# Patient Record
Sex: Female | Born: 1980 | Race: Black or African American | Hispanic: No | Marital: Married | Smoking: Former smoker
Health system: Southern US, Community
[De-identification: ages and names within clinical notes are randomized; demographics above are authoritative.]

---

## 2017-08-31 ENCOUNTER — Encounter: Payer: Self-pay | Admitting: Family Medicine

## 2017-08-31 ENCOUNTER — Ambulatory Visit: Payer: BC Managed Care – PPO | Admitting: Family Medicine

## 2017-08-31 VITALS — Temp 99.2°F | Ht 61.0 in | Wt 161.0 lb

## 2017-08-31 DIAGNOSIS — Z131 Encounter for screening for diabetes mellitus: Secondary | ICD-10-CM

## 2017-08-31 DIAGNOSIS — Z13220 Encounter for screening for lipoid disorders: Secondary | ICD-10-CM

## 2017-08-31 DIAGNOSIS — L659 Nonscarring hair loss, unspecified: Secondary | ICD-10-CM

## 2017-08-31 DIAGNOSIS — Z0000 Encounter for general adult medical examination without abnormal findings: Secondary | ICD-10-CM | POA: Diagnosis not present

## 2017-08-31 DIAGNOSIS — J302 Other seasonal allergic rhinitis: Secondary | ICD-10-CM

## 2017-08-31 LAB — BASIC METABOLIC PANEL
BUN: 10 mg/dL (ref 6–23)
CALCIUM: 9.1 mg/dL (ref 8.4–10.5)
CO2: 28 meq/L (ref 19–32)
Chloride: 105 mEq/L (ref 96–112)
Creatinine, Ser: 0.68 mg/dL (ref 0.40–1.20)
GFR: 103.33 mL/min (ref >60.00)
GLUCOSE: 83 mg/dL (ref 70–99)
Potassium: 4.2 mEq/L (ref 3.5–5.1)
SODIUM: 140 meq/L (ref 135–145)

## 2017-08-31 LAB — TSH: TSH: 1.07 u[IU]/mL (ref 0.35–4.50)

## 2017-08-31 LAB — LIPID PANEL
CHOL/HDL RATIO: 3
Cholesterol: 177 mg/dL (ref 0–200)
HDL: 52.6 mg/dL (ref >39.00)
LDL Cholesterol: 113 mg/dL — ABNORMAL HIGH (ref 0–99)
NONHDL: 124.89
Triglycerides: 57 mg/dL (ref 0.0–149.0)
VLDL: 11.4 mg/dL (ref 0.0–40.0)

## 2017-08-31 LAB — CBC WITH DIFFERENTIAL/PLATELET
BASOS ABS: 0 10*3/uL (ref 0.0–0.1)
BASOS PCT: 0.4 % (ref 0.0–3.0)
EOS PCT: 3.1 % (ref 0.0–5.0)
Eosinophils Absolute: 0.2 10*3/uL (ref 0.0–0.7)
HEMATOCRIT: 42.3 % (ref 36.0–46.0)
Hemoglobin: 14.4 g/dL (ref 12.0–15.0)
LYMPHS PCT: 23.5 % (ref 12.0–46.0)
Lymphs Abs: 1.5 10*3/uL (ref 0.7–4.0)
MCHC: 33.9 g/dL (ref 30.0–36.0)
MCV: 93 fl (ref 78.0–100.0)
MONOS PCT: 8.9 % (ref 3.0–12.0)
Monocytes Absolute: 0.6 10*3/uL (ref 0.1–1.0)
Neutro Abs: 4 10*3/uL (ref 1.4–7.7)
Neutrophils Relative %: 64.1 % (ref 43.0–77.0)
PLATELETS: 183 10*3/uL (ref 150.0–400.0)
RBC: 4.55 Mil/uL (ref 3.87–5.11)
RDW: 12.4 % (ref 11.5–15.5)
WBC: 6.2 10*3/uL (ref 4.0–10.5)

## 2017-08-31 LAB — T4, FREE: Free T4: 0.71 ng/dL (ref 0.60–1.60)

## 2017-08-31 LAB — IRON: Iron: 89 ug/dL (ref 42–145)

## 2017-08-31 LAB — HEMOGLOBIN A1C: Hgb A1c MFr Bld: 5.2 % (ref 4.6–6.5)

## 2017-08-31 LAB — FERRITIN: Ferritin: 18.5 ng/mL (ref 10.0–291.0)

## 2017-08-31 NOTE — Progress Notes (Signed)
Patient presents to clinic today to establish care.  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  [place].  Hair loss: -pt endorses thinning hair x 3 yrs since the birth of her son. -pt does not note hair shedding more than normal, heat or cold intolerance, diarrhea, or constipation.  Rash on back: -Pt endorses rash on her back since childhood. -pt thought she had vitiligo, but her mother always told her that it was similar to what her dad had on his back (tinea versicolor) and she should just use Selsun Blue on it. -Patient states that the rash has constantly the same. -Patient denies increased and hypopigmentation or itching  Seasonal allergies: -Patient will take Claritin as needed  Allergies: ADA Past surgical history: C-section 2011 and 2016  Social history: Patient has 2 kids.  Patient has a PhD.  Patient works as a Scientist, product/process development at [employer]  Patient endorses social alcohol use.  Patient endorses former tobacco use.  She quit in June 2019.  Patient states she smoked a 13 and then endorses regular smoking in her early 57s.  Patient was smoking 7 cigarettes/day.  She then changed to 2 black and milds per day.  Patient denies drug use.  Family medical history: Mom-alive, MI, heart disease, HTN Dad-alive, alcohol abuse, drug abuse Son-asthma MGM-alive, heart attack, heart disease MGF-deceased PGM-deceased, alcohol abuse PGF-deceased, alcohol abuse  Health Maintenance: Dental --[facility] P[facility] --2017 LMP--08/30/2017, ongoing  History reviewed. No pertinent past medical history.  Past Surgical History:  Procedure Laterality Date  . CESAREAN SECTION      No current outpatient medications on file prior to visit.   No current facility-administered medications on file prior to visit.     No Known Allergies  Family History  Problem  Relation Age of Onset  . Heart disease Mother   . Hypertension Mother   . Alcohol abuse Father   . Drug abuse Father   . Heart disease Maternal Grandmother     Social History   Socioeconomic History  . Marital status: Unknown    Spouse name: Not on file  . Number of children: Not on file  . Years of education: Not on file  . Highest education level: Not on file  Occupational History  . Not on file  Social Needs  . Financial resource strain: Not on file  . Food insecurity:    Worry: Not on file    Inability: Not on file  . Transportation needs:    Medical: Not on file    Non-medical: Not on file  Tobacco Use  . Smoking status: Former Games developer  . Smokeless tobacco: Former Engineer, water and Sexual Activity  . Alcohol use: Yes  . Drug use: Never  . Sexual activity: Yes  Lifestyle  . Physical activity:    Days per week: Not on file    Minutes per session: Not on file  . Stress: Not on file  Relationships  . Social connections:    Talks on phone: Not on file    Gets together: Not on file    Attends religious service: Not on file    Active member of club or organization: Not on file    Attends meetings of clubs or organizations: Not on file    Relationship status: Not on file  . Intimate partner violence:    Fear of current  or ex partner: Not on file    Emotionally abused: Not on file    Physically abused: Not on file    Forced sexual activity: Not on file  Other Topics Concern  . Not on file  Social History Narrative  . Not on file    ROS General: Denies fever, chills, night sweats, changes in weight, changes in appetite  + thinning hair HEENT: Denies headaches, ear pain, changes in vision, rhinorrhea, sore throat CV: Denies CP, palpitations, SOB, orthopnea Pulm: Denies SOB, cough, wheezing GI: Denies abdominal pain, nausea, vomiting, diarrhea, constipation GU: Denies dysuria, hematuria, frequency, vaginal discharge Msk: Denies muscle cramps, joint  pains Neuro: Denies weakness, numbness, tingling Skin: Denies rashes, bruising Psych: Denies depression, anxiety, hallucinations  Temp 99.2 F (37.3 C) (Oral)   Ht  (1.549 m)   Wt 161 lb (73 kg)   LMP 08/31/2017 (Exact Date)   BMI 30.42 kg/m   Physical Exam Gen. Pleasant, well developed, well-nourished, in NAD HEENT - [place]/AT, PERRL, EOMI, conjunctive clear, no scleral icterus, no nasal drainage, pharynx without erythema or exudate. Lungs: no use of accessory muscles, CTAB, no wheezes, rales or rhonchi Cardiovascular: RRR, no r/g/m, no peripheral edema Abdomen: BS present, soft, nontender,nondistended, no hepatosplenomegaly Musculoskeletal: No deformities, moves all four extremities, no cyanosis or clubbing, normal tone Neuro:  A&Ox3, CN II-XII intact, normal gait Skin:  Warm, dry, intact, no lesions.  Numerous hypopigmented circumscribed flat lesions on back Psych: normal affect, mood appropriate  No results found for this or any previous visit (from the past 2160 hour(s)).  Assessment/Plan: Well adult exam  -Anticipatory guidance given including wearing seatbelts, smoke detectors in the home, increasing physical activity, increasing p.o. intake of water, increasing p.o. intake of vegetables -Next CPE in 1 year -Last Pap 2017.  Due next year - Plan: Basic metabolic panel  Hair thinning -Consider referral to dermatology  - Plan: TSH, T4, free, CBC with Differential/Platelet, Ferritin, Iron  Seasonal allergies -Continue Claritin as needed  Screening for cholesterol level  - Plan: Lipid panel  Screening for diabetes mellitus  - Plan: Hemoglobin A1c  Follow-up PRN  Abbe Amsterdam, MD

## 2017-08-31 NOTE — Patient Instructions (Signed)
Preventive Care 18-39 Years, Female Preventive care refers to lifestyle choices and visits with your health care provider that can promote health and wellness. What does preventive care include?  A yearly physical exam. This is also called an annual well check.  Dental exams once or twice a year.  Routine eye exams. Ask your health care provider how often you should have your eyes checked.  Personal lifestyle choices, including: ? Daily care of your teeth and gums. ? Regular physical activity. ? Eating a healthy diet. ? Avoiding tobacco and drug use. ? Limiting alcohol use. ? Practicing safe sex. ? Taking vitamin and mineral supplements as recommended by your health care provider. What happens during an annual well check? The services and screenings done by your health care provider during your annual well check will depend on your age, overall health, lifestyle risk factors, and family history of disease. Counseling Your health care provider may ask you questions about your:  Alcohol use.  Tobacco use.  Drug use.  Emotional well-being.  Home and relationship well-being.  Sexual activity.  Eating habits.  Work and work Statistician.  Method of birth control.  Menstrual cycle.  Pregnancy history.  Screening You may have the following tests or measurements:  Height, weight, and BMI.  Diabetes screening. This is done by checking your blood sugar (glucose) after you have not eaten for a while (fasting).  Blood pressure.  Lipid and cholesterol levels. These may be checked every 5 years starting at age 66.  Skin check.  Hepatitis C blood test.  Hepatitis B blood test.  Sexually transmitted disease (STD) testing.  BRCA-related cancer screening. This may be done if you have a family history of breast, ovarian, tubal, or peritoneal cancers.  Pelvic exam and Pap test. This may be done every 3 years starting at age 40. Starting at age 59, this may be done every 5  years if you have a Pap test in combination with an HPV test.  Discuss your test results, treatment options, and if necessary, the need for more tests with your health care provider. Vaccines Your health care provider may recommend certain vaccines, such as:  Influenza vaccine. This is recommended every year.  Tetanus, diphtheria, and acellular pertussis (Tdap, Td) vaccine. You may need a Td booster every 10 years.  Varicella vaccine. You may need this if you have not been vaccinated.  HPV vaccine. If you are 69 or younger, you may need three doses over 6 months.  Measles, mumps, and rubella (MMR) vaccine. You may need at least one dose of MMR. You may also need a second dose.  Pneumococcal 13-valent conjugate (PCV13) vaccine. You may need this if you have certain conditions and were not previously vaccinated.  Pneumococcal polysaccharide (PPSV23) vaccine. You may need one or two doses if you smoke cigarettes or if you have certain conditions.  Meningococcal vaccine. One dose is recommended if you are age 27-21 years and a first-year college student living in a residence hall, or if you have one of several medical conditions. You may also need additional booster doses.  Hepatitis A vaccine. You may need this if you have certain conditions or if you travel or work in places where you may be exposed to hepatitis A.  Hepatitis B vaccine. You may need this if you have certain conditions or if you travel or work in places where you may be exposed to hepatitis B.  Haemophilus influenzae type b (Hib) vaccine. You may need this if  you have certain risk factors.  Talk to your health care provider about which screenings and vaccines you need and how often you need them. This information is not intended to replace advice given to you by your health care provider. Make sure you discuss any questions you have with your health care provider. Document Released: 06/14/2001 Document Revised: 01/06/2016  Document Reviewed: 02/17/2015 Elsevier Interactive Patient Education  [employer].

## 2017-09-06 ENCOUNTER — Encounter: Payer: Self-pay | Admitting: *Deleted

## 2019-07-11 ENCOUNTER — Ambulatory Visit: Payer: BC Managed Care – PPO | Attending: Family

## 2019-07-11 DIAGNOSIS — Z23 Encounter for immunization: Secondary | ICD-10-CM

## 2019-07-11 NOTE — Progress Notes (Signed)
   Covid-19 Vaccination Clinic  Name:  Janet Lee    MRN: [id number] DOB: Feb 09, 1981  07/11/2019  Janet Lee was observed post Covid-19 immunization for 15 minutes without incident. She was provided with Vaccine Information Sheet and instruction to access the V-Safe system.   Janet Lee was instructed to call 911 with any severe reactions post vaccine: Marland Kitchen Difficulty breathing  . Swelling of face and throat  . A fast heartbeat  . A bad rash all over body  . Dizziness and weakness   Immunizations Administered    Name Date Dose VIS Date Route   Moderna COVID-19 Vaccine 07/11/2019  4:18 PM 0.5 mL 04/02/2019 Intramuscular   Manufacturer: Moderna   Lot: [id number]   NDC: 37096-438-38

## 2019-08-13 ENCOUNTER — Ambulatory Visit: Payer: BC Managed Care – PPO | Attending: Family

## 2019-08-13 DIAGNOSIS — Z23 Encounter for immunization: Secondary | ICD-10-CM

## 2019-08-13 NOTE — Progress Notes (Signed)
   Covid-19 Vaccination Clinic  Name:  Janet Lee    MRN: [id number] DOB: Mar 06, 1981  08/13/2019  Ms. Janet Lee was observed post Covid-19 immunization for 15 minutes without incident. She was provided with Vaccine Information Sheet and instruction to access the V-Safe system.   Ms. Janet Lee was instructed to call 911 with any severe reactions post vaccine: Marland Kitchen Difficulty breathing  . Swelling of face and throat  . A fast heartbeat  . A bad rash all over body  . Dizziness and weakness   Immunizations Administered    Name Date Dose VIS Date Route   Moderna COVID-19 Vaccine 08/13/2019 11:57 AM 0.5 mL 04/02/2019 Intramuscular   Manufacturer: Moderna   Lot: [id number]   NDC: 31438-887-57

## 2020-01-29 NOTE — Progress Notes (Signed)
New Patient Note  RE: Janet Lee MRN: [id number] DOB: 1980-10-01 Date of Office Visit: 01/30/2020  Referring provider: No ref. provider found Primary care provider: Deeann Saint, MD  Chief Complaint: Wheezing and Allergies (sneezing)  History of Present Illness: I had the pleasure of seeing Janet Lee for initial evaluation at the [facility] on 01/30/2020. She is a 39 y.o. female, who is self-referred here by Deeann Saint, MD for the evaluation of asthma and allergic rhinitis.   Patient moved from [place] to [place] a few years ago and noticed the below symptoms.   Respiratory She reports symptoms of chest tightness, shortness of breath, coughing, wheezing for 3 years. Current medications include albuterol prn which help. She reports not using aerochamber with inhalers. She tried the following inhalers: none. Main triggers are allergies, infections. In the last month, frequency of symptoms: 0x/week. Frequency of nocturnal symptoms: 0x/month. Frequency of SABA use: 0x/week. Interference with physical activity: no. Sleep is undisturbed. In the last 12 months, emergency room visits/urgent care visits/doctor office visits or hospitalizations due to respiratory issues: one. In the last 12 months, oral steroids courses: no. Lifetime history of hospitalization for respiratory issues: no. Prior intubations: no.History of pneumonia: no. She was not evaluated by allergist/pulmonologist in the past. Smoking exposure: 1 cigs per day. Up to date with flu vaccine: yes. Up to date with COVID-19 vaccine: yes.  History of reflux: yes but not on any medications.  Rhinitis: She reports symptoms of itchy, sneezing, nasal congestion, rhinorrhea, itchy/watery eyes. Symptoms have been going on for 3 years. The symptoms are present mainly in the spring and fall. Other triggers include exposure to pollen. Anosmia: no. Headache: no. She has used Claritin and allegra, OTC nasal spray  with some improvement in symptoms. Sinus infections: no. Previous work up includes: none. Previous ENT evaluation: no. Previous sinus imaging: no. History of nasal polyps: no. Last eye exam: not recently.  Assessment and Plan: Onda is a 39 y.o. female with: Reactive airway disease Noted chest tightness, shortness of breath, coughing and wheezing for 3 years.  This started when she moved to [place] from [place]. Symptoms improved gradually. Uses albuterol on a rare occasion with good benefit.  Main triggers are allergies and infections.  Patient is a current tobacco user.  Today's spirometry was normal with 13% improvement in FEV1 post bronchodilator treatment.  Clinically feeling improved.    This is concerning for reactive airway disease. . Daily controller medication(s):  Start Singulair (montelukast) 10mg  daily at night. Cautioned that in some children/adults can experience behavioral changes including hyperactivity, agitation, depression, sleep disturbances and suicidal ideations. These side effects are rare, but if you notice them you should notify me and discontinue Singulair (montelukast). . May use albuterol rescue inhaler 2 puffs every 4 to 6 hours as needed for shortness of breath, chest tightness, coughing, and wheezing. May use albuterol rescue inhaler 2 puffs 5 to 15 minutes prior to strenuous physical activities. Monitor frequency of use.  . Repeat spirometry at next visit.  If symptoms not improved then will do a trial of ICS inhaler next.  Other allergic rhinitis Rhinoconjunctivitis symptoms for the past 3 years mainly in the spring and fall.  Tried Claritin, Allegra and nasal sprays with some benefit.  No prior allergy/ENT evaluation.  Today's skin testing showed: Positive to weed, ragweed, trees, mold, dust mites, cat and cockroach.  Start environmental control measures as below.  May use over the counter  antihistamines such as Zyrtec (cetirizine), Claritin  (loratadine), Allegra (fexofenadine), or Xyzal (levocetirizine) daily as needed. May take twice a day if needed. Start dymista (fluticasone + azelastine nasal spray combination) 1 spray per nostril twice a day. If it's not covered let us know.   May use olopatadine eye drops 0.2% once a day as needed for itchy/watery eyes.  We will discuss allergy immunotherapy in more detail next visit - handout given.  Allergic conjunctivitis of both eyes  See assessment and plan as above for allergic rhinitis.  Return in about 2 months (around 03/31/2020).  Meds ordered this encounter  Medications  . Azelastine-Fluticasone 137-50 MCG/ACT SUSP    Sig: Place 1 spray into the nose in the morning and at bedtime.    Dispense:  23 g    Refill:  5  . Olopatadine HCl 0.2 % SOLN    Sig: Apply 1 drop to eye daily as needed (itchy/watery eyes).    Dispense:  2.5 mL    Refill:  5  . montelukast (SINGULAIR) 10 MG tablet    Sig: Take 1 tablet (10 mg total) by mouth at bedtime.    Dispense:  30 tablet    Refill:  5   Other allergy screening: Food allergy: no Medication allergy: no Hymenoptera allergy: no Urticaria: no Eczema: yes  History of recurrent infections suggestive of immunodeficency: no  Diagnostics: Spirometry:  Tracings reviewed. Her effort: Good reproducible efforts. FVC: 3.40L FEV1: 2.89L, 126% predicted FEV1/FVC ratio: 85% Interpretation: Spirometry consistent with normal pattern with 13% improvement in FEV1 post bronchodilator treatment. Clinically feeling improved.  Please see scanned spirometry results for details.  Skin Testing: Environmental allergy panel. Positive to weed, ragweed, trees, mold, dust mites, cat and cockroach. Results discussed with patient/family.  Airborne Adult Perc - 01/30/20 0906    Time Antigen Placed 1610    Allergen Manufacturer Waynette Buttery    Location Back    Number of Test 59    Panel 1 Select    1. Control-Buffer 50% Glycerol Negative    2.  Control-Histamine 1 mg/ml 2+    3. Albumin saline Negative    4. Bahia Negative    5. French Southern Territories Negative    6. Johnson Negative    7. Kentucky Blue Negative    8. Meadow Fescue Negative    9. Perennial Rye Negative    10. Sweet Vernal Negative    11. Timothy Negative    12. Cocklebur Negative    13. Burweed Marshelder Negative    14. Ragweed, short 3+    15. Ragweed, Giant Negative    16. Plantain,  English 2+    17. Lamb's Quarters Negative    18. Sheep Sorrell Negative    19. Rough Pigweed Negative    20. Marsh Elder, Rough Negative    21. Mugwort, Common Negative    22. Ash mix Negative    23. Birch mix Negative    24. Beech American 2+    25. Box, Elder Negative    26. Cedar, red Negative    27. Cottonwood, Guinea-Bissau Negative    28. Elm mix Negative    29. Hickory Negative    30. Maple mix Negative    31. Oak, Guinea-Bissau mix Negative    32. Pecan Pollen Negative    33. Pine mix 2+    34. Sycamore Eastern Negative    35. Walnut, Black Pollen 2+    36. Alternaria alternata Negative    37. Cladosporium Herbarum Negative  38. Aspergillus mix 2+    39. Penicillium mix Negative    40. Bipolaris sorokiniana (Helminthosporium) Negative    41. Drechslera spicifera (Curvularia) Negative    42. Mucor plumbeus 2+    43. Fusarium moniliforme 2+    44. Aureobasidium pullulans (pullulara) 2+    45. Rhizopus oryzae Negative    46. Botrytis cinera Negative    47. Epicoccum nigrum 2+    48. Phoma betae Negative    49. Candida Albicans Negative    50. Trichophyton mentagrophytes Negative    51. Mite, D Farinae  5,000 AU/ml 4+    52. Mite, D Pteronyssinus  5,000 AU/ml 4+    53. Cat Hair 10,000 BAU/ml 4+    54.  Dog Epithelia Negative    55. Mixed Feathers Negative    56. Horse Epithelia Negative    57. Cockroach, German Negative    58. Mouse Negative    59. Tobacco Leaf Negative          Intradermal - 01/30/20 0934    Time Antigen Placed 3016    Allergen Manufacturer Waynette Buttery      Location Arm    Number of Test 8    Control Negative    French Southern Territories Negative    Johnson Negative    7 Grass Negative    Mold 1 Negative    Mold 3 Negative    Dog Negative    Cockroach 2+           Past Medical History: Patient Active Problem List   Diagnosis Date Noted  . Other allergic rhinitis 01/30/2020  . Reactive airway disease 01/30/2020  . Allergic conjunctivitis of both eyes 01/30/2020   History reviewed. No pertinent past medical history. Past Surgical History: Past Surgical History:  Procedure Laterality Date  . CESAREAN SECTION     Medication List:  Current Outpatient Medications  Medication Sig Dispense Refill  . Azelastine-Fluticasone 137-50 MCG/ACT SUSP Place 1 spray into the nose in the morning and at bedtime. 23 g 5  . montelukast (SINGULAIR) 10 MG tablet Take 1 tablet (10 mg total) by mouth at bedtime. 30 tablet 5  . Olopatadine HCl 0.2 % SOLN Apply 1 drop to eye daily as needed (itchy/watery eyes). 2.5 mL 5   No current facility-administered medications for this visit.   Allergies: No Known Allergies Social History: Social History   Socioeconomic History  . Marital status: Married    Spouse name: Not on file  . Number of children: Not on file  . Years of education: Not on file  . Highest education level: Not on file  Occupational History  . Not on file  Tobacco Use  . Smoking status: Current Every Day Smoker  . Smokeless tobacco: Former Engineer, water and Sexual Activity  . Alcohol use: Yes  . Drug use: Never  . Sexual activity: Yes  Other Topics Concern  . Not on file  Social History Narrative  . Not on file   Social Determinants of Health   Financial Resource Strain:   . Difficulty of Paying Living Expenses: Not on file  Food Insecurity:   . Worried About Programme researcher, broadcasting/film/video in the Last Year: Not on file  . Ran Out of Food in the Last Year: Not on file  Transportation Needs:   . Lack of Transportation (Medical): Not on file  .  Lack of Transportation (Non-Medical): Not on file  Physical Activity:   . Days of Exercise per Week: Not  on file  . Minutes of Exercise per Session: Not on file  Stress:   . Feeling of Stress : Not on file  Social Connections:   . Frequency of Communication with Friends and Family: Not on file  . Frequency of Social Gatherings with Friends and Family: Not on file  . Attends Religious Services: Not on file  . Active Member of Clubs or Organizations: Not on file  . Attends Banker Meetings: Not on file  . Marital Status: Not on file   Lives in a house. Smoking: yes - 1 cig/day? Occupation: Geophysicist/field seismologist professor  Environmental History: Immunologist in the house: no Engineer, civil (consulting) in the family room: yes Carpet in the bedroom: yes Heating: gas Cooling: central Pet: no  Family History: Family History  Problem Relation Age of Onset  . Heart disease Mother   . Hypertension Mother   . Alcohol abuse Father   . Drug abuse Father   . Heart disease Maternal Grandmother    Problem                               Relation Asthma                                   Son  Eczema                                Son  Food allergy                          Son, mother  Allergic rhino conjunctivitis     No   Review of Systems  Constitutional: Negative for appetite change, chills, fever and unexpected weight change.  HENT: Negative for congestion and rhinorrhea.   Eyes: Positive for itching.  Respiratory: Negative for cough, chest tightness, shortness of breath and wheezing.   Cardiovascular: Negative for chest pain.  Gastrointestinal: Negative for abdominal pain.  Genitourinary: Negative for difficulty urinating.  Skin: Negative for rash.  Allergic/Immunologic: Positive for environmental allergies.  Neurological: Negative for headaches.   Objective: BP 120/88   Pulse 80   Temp (!) 97.1 F (36.2 C) (Temporal)   Resp 16   Ht 5' 1.5" (1.562 m)   Wt 168 lb 6.4 oz (76.4 kg)    SpO2 98%   BMI 31.30 kg/m  Body mass index is 31.3 kg/m. Physical Exam Vitals and nursing note reviewed.  Constitutional:      Appearance: Normal appearance. She is well-developed.  HENT:     Head: Normocephalic and atraumatic.     Right Ear: External ear normal.     Left Ear: External ear normal.     Nose: Nose normal.     Mouth/Throat:     Mouth: Mucous membranes are moist.     Pharynx: Oropharynx is clear.  Eyes:     Conjunctiva/sclera: Conjunctivae normal.  Cardiovascular:     Rate and Rhythm: Normal rate and regular rhythm.     Heart sounds: Normal heart sounds. No murmur heard.  No friction rub. No gallop.   Pulmonary:     Effort: Pulmonary effort is normal.     Breath sounds: Normal breath sounds. No wheezing, rhonchi or rales.  Abdominal:     Palpations: Abdomen is soft.  Musculoskeletal:     Cervical back: Neck supple.  Skin:    General: Skin is warm.     Findings: No rash.  Neurological:     Mental Status: She is alert and oriented to person, place, and time.  Psychiatric:        Behavior: Behavior normal.    The plan was reviewed with the patient/family, and all questions/concerned were addressed.  It was my pleasure to see Lattie CornsJeannette today and participate in her care. Please feel free to contact me with any questions or concerns.  Sincerely,  Wyline MoodYoon Shrihan Putt, DO Allergy & Immunology  [facility] [place] office: [phone] [facility] office: [phone]

## 2020-01-30 ENCOUNTER — Encounter: Payer: Self-pay | Admitting: Allergy

## 2020-01-30 ENCOUNTER — Other Ambulatory Visit: Payer: Self-pay

## 2020-01-30 ENCOUNTER — Ambulatory Visit (INDEPENDENT_AMBULATORY_CARE_PROVIDER_SITE_OTHER): Payer: BC Managed Care – PPO | Admitting: Allergy

## 2020-01-30 VITALS — BP 120/88 | HR 80 | Temp 97.1°F | Resp 16 | Ht 61.5 in | Wt 168.4 lb

## 2020-01-30 DIAGNOSIS — J3089 Other allergic rhinitis: Secondary | ICD-10-CM | POA: Diagnosis not present

## 2020-01-30 DIAGNOSIS — J4520 Mild intermittent asthma, uncomplicated: Secondary | ICD-10-CM

## 2020-01-30 DIAGNOSIS — H1013 Acute atopic conjunctivitis, bilateral: Secondary | ICD-10-CM | POA: Insufficient documentation

## 2020-01-30 DIAGNOSIS — J45909 Unspecified asthma, uncomplicated: Secondary | ICD-10-CM | POA: Insufficient documentation

## 2020-01-30 MED ORDER — AZELASTINE-FLUTICASONE 137-50 MCG/ACT NA SUSP: 1.0000 | Freq: Two times a day (BID) | NASAL | 5 refills | Status: DC

## 2020-01-30 MED ORDER — MONTELUKAST SODIUM 10 MG PO TABS: 10.0000 mg | ORAL_TABLET | Freq: Every day | ORAL | 5 refills | Status: DC

## 2020-01-30 MED ORDER — OLOPATADINE HCL 0.2 % OP SOLN: 1.0000 [drp] | Freq: Every day | OPHTHALMIC | 5 refills | Status: DC | PRN

## 2020-01-30 NOTE — Patient Instructions (Addendum)
Today's skin testing showed: Positive to weed, ragweed, trees, mold, dust mites, cat and cockroach.  Environmental allergies  Start environmental control measures as below.  May use over the counter antihistamines such as Zyrtec (cetirizine), Claritin (loratadine), Allegra (fexofenadine), or Xyzal (levocetirizine) daily as needed. May take twice a day if needed. Start dymista (fluticasone + azelastine nasal spray combination) 1 spray per nostril twice a day. If it's not covered let us know.   May use olopatadine eye drops 0.2% once a day as needed for itchy/watery eyes.  Read about allergy injections.  Breathing:  Your breathing test did show improvement after your treatment.  . Daily controller medication(s):  Start Singulair (montelukast) 10mg  daily at night. Cautioned that in some children/adults can experience behavioral changes including hyperactivity, agitation, depression, sleep disturbances and suicidal ideations. These side effects are rare, but if you notice them you should notify me and discontinue Singulair (montelukast). . May use albuterol rescue inhaler 2 puffs every 4 to 6 hours as needed for shortness of breath, chest tightness, coughing, and wheezing. May use albuterol rescue inhaler 2 puffs 5 to 15 minutes prior to strenuous physical activities. Monitor frequency of use.  . Asthma control goals:  o Full participation in all desired activities (may need albuterol before activity) o Albuterol use two times or less a week on average (not counting use with activity) o Cough interfering with sleep two times or less a month o Oral steroids no more than once a year o No hospitalizations  Follow up in 2 months or sooner if needed.   Reducing Pollen Exposure . Pollen seasons: trees (spring), grass (summer) and ragweed/weeds (fall). 09-08-1980 Keep windows closed in your home and car to lower pollen exposure.  Marland Kitchen air conditioning in the bedroom and throughout the house if  possible.  . Avoid going out in dry windy days - especially early morning. . Pollen counts are highest between 5 - 10 AM and on dry, hot and windy days.  . Save outside activities for late afternoon or after a heavy rain, when pollen levels are lower.  . Avoid mowing of grass if you have grass pollen allergy. Lilian Kapur Be aware that pollen can also be transported indoors on people and pets.  . Dry your clothes in an automatic dryer rather than hanging them outside where they might collect pollen.  . Rinse hair and eyes before bedtime. Mold Control . Mold and fungi can grow on a variety of surfaces provided certain temperature and moisture conditions exist.  . Outdoor molds grow on plants, decaying vegetation and soil. The major outdoor mold, Alternaria and Cladosporium, are found in very high numbers during hot and dry conditions. Generally, a late summer - fall peak is seen for common outdoor fungal spores. Rain will temporarily lower outdoor mold spore count, but counts rise rapidly when the rainy period ends. . The most important indoor molds are Aspergillus and Penicillium. Dark, humid and poorly ventilated basements are ideal sites for mold growth. The next most common sites of mold growth are the bathroom and the kitchen. Outdoor (Seasonal) Mold Control . Use air conditioning and keep windows closed. . Avoid exposure to decaying vegetation. 02-26-1976 Avoid leaf raking. . Avoid grain handling. . Consider wearing a face mask if working in moldy areas.  Indoor (Perennial) Mold Control  . Maintain humidity below 50%. . Get rid of mold growth on hard surfaces with water, detergent and, if necessary, 5% bleach (do not mix with other cleaners). Then  dry the area completely. If mold covers an area more than 10 square feet, consider hiring an indoor environmental professional. . For clothing, washing with soap and water is best. If moldy items cannot be cleaned and dried, throw them away. . Remove sources e.g.  contaminated carpets. . Repair and seal leaking roofs or pipes. Using dehumidifiers in damp basements may be helpful, but empty the water and clean units regularly to prevent mildew from forming. All rooms, especially basements, bathrooms and kitchens, require ventilation and cleaning to deter mold and mildew growth. Avoid carpeting on concrete or damp floors, and storing items in damp areas. Control of House Dust Mite Allergen . Dust mite allergens are a common trigger of allergy and asthma symptoms. While they can be found throughout the house, these microscopic creatures thrive in warm, humid environments such as bedding, upholstered furniture and carpeting. . Because so much time is spent in the bedroom, it is essential to reduce mite levels there.  . Encase pillows, mattresses, and box springs in special allergen-proof fabric covers or airtight, zippered plastic covers.  . Bedding should be washed weekly in hot water (130 F) and dried in a hot dryer. Allergen-proof covers are available for comforters and pillows that can't be regularly washed.  Reyes Ivan the allergy-proof covers every few months. Minimize clutter in the bedroom. Keep pets out of the bedroom.  Marland Kitchen Keep humidity less than 50% by using a dehumidifier or air conditioning. You can buy a humidity measuring device called a hygrometer to monitor this.  . If possible, replace carpets with hardwood, linoleum, or washable area rugs. If that's not possible, vacuum frequently with a vacuum that has a HEPA filter. . Remove all upholstered furniture and non-washable window drapes from the bedroom. . Remove all non-washable stuffed toys from the bedroom.  Wash stuffed toys weekly. Pet Allergen Avoidance: . Contrary to popular opinion, there are no "hypoallergenic" breeds of dogs or cats. That is because people are not allergic to an animal's hair, but to an allergen found in the animal's saliva, dander (dead skin flakes) or urine. Pet allergy symptoms  typically occur within minutes. For some people, symptoms can build up and become most severe 8 to 12 hours after contact with the animal. People with severe allergies can experience reactions in public places if dander has been transported on the pet owners' clothing. Marland Kitchen Keeping an animal outdoors is only a partial solution, since homes with pets in the yard still have higher concentrations of animal allergens. . Before getting a pet, ask your allergist to determine if you are allergic to animals. If your pet is already considered part of your family, try to minimize contact and keep the pet out of the bedroom and other rooms where you spend a great deal of time. . As with dust mites, vacuum carpets often or replace carpet with a hardwood floor, tile or linoleum. . High-efficiency particulate air (HEPA) cleaners can reduce allergen levels over time. . While dander and saliva are the source of cat and dog allergens, urine is the source of allergens from rabbits, hamsters, mice and [place] pigs; so ask a non-allergic family member to clean the animal's cage. . If you have a pet allergy, talk to your allergist about the potential for allergy immunotherapy (allergy shots). This strategy can often provide long-term relief. Cockroach Allergen Avoidance Cockroaches are often found in the homes of densely populated urban areas, schools or commercial buildings, but these creatures can lurk almost anywhere. This  does not mean that you have a dirty house or living area. . Block all areas where roaches can enter the home. This includes crevices, wall cracks and windows.  . Cockroaches need water to survive, so fix and seal all leaky faucets and pipes. Have an exterminator go through the house when your family and pets are gone to eliminate any remaining roaches. Marland Kitchen Keep food in lidded containers and put pet food dishes away after your pets are done eating. Vacuum and sweep the floor after meals, and take out garbage  and recyclables. Use lidded garbage containers in the kitchen. Wash dishes immediately after use and clean under stoves, refrigerators or toasters where crumbs can accumulate. Wipe off the stove and other kitchen surfaces and cupboards regularly.

## 2020-01-30 NOTE — Assessment & Plan Note (Signed)
Rhinoconjunctivitis symptoms for the past 3 years mainly in the spring and fall.  Tried Claritin, Allegra and nasal sprays with some benefit.  No prior allergy/ENT evaluation.  Today's skin testing showed: Positive to weed, ragweed, trees, mold, dust mites, cat and cockroach.  Start environmental control measures as below.  May use over the counter antihistamines such as Zyrtec (cetirizine), Claritin (loratadine), Allegra (fexofenadine), or Xyzal (levocetirizine) daily as needed. May take twice a day if needed. Start dymista (fluticasone + azelastine nasal spray combination) 1 spray per nostril twice a day. If it's not covered let us know.   May use olopatadine eye drops 0.2% once a day as needed for itchy/watery eyes.  We will discuss allergy immunotherapy in more detail next visit - handout given.

## 2020-01-30 NOTE — Assessment & Plan Note (Signed)
   See assessment and plan as above for allergic rhinitis.  

## 2020-01-30 NOTE — Assessment & Plan Note (Addendum)
Noted chest tightness, shortness of breath, coughing and wheezing for 3 years.  This started when she moved to [place] from [place]. Symptoms improved gradually. Uses albuterol on a rare occasion with good benefit.  Main triggers are allergies and infections.  Patient is a current tobacco user.  Today's spirometry was normal with 13% improvement in FEV1 post bronchodilator treatment.  Clinically feeling improved.    This is concerning for reactive airway disease. . Daily controller medication(s):  Start Singulair (montelukast) 10mg  daily at night. Cautioned that in some children/adults can experience behavioral changes including hyperactivity, agitation, depression, sleep disturbances and suicidal ideations. These side effects are rare, but if you notice them you should notify me and discontinue Singulair (montelukast). . May use albuterol rescue inhaler 2 puffs every 4 to 6 hours as needed for shortness of breath, chest tightness, coughing, and wheezing. May use albuterol rescue inhaler 2 puffs 5 to 15 minutes prior to strenuous physical activities. Monitor frequency of use.  . Repeat spirometry at next visit.  If symptoms not improved then will do a trial of ICS inhaler next.

## 2020-02-06 ENCOUNTER — Encounter: Payer: BC Managed Care – PPO | Admitting: Family Medicine

## 2020-03-31 DIAGNOSIS — H1010 Acute atopic conjunctivitis, unspecified eye: Secondary | ICD-10-CM | POA: Insufficient documentation

## 2020-03-31 NOTE — Progress Notes (Deleted)
Follow Up Note  RE: Janet Lee MRN: [id number] DOB: 1980/08/06 Date of Office Visit: 04/01/2020  Referring provider: Deeann Saint, MD Primary care provider: Deeann Saint, MD  Chief Complaint: No chief complaint on file.  History of Present Illness: I had the pleasure of seeing Janet Lee for a follow up visit at the [facility] on 03/31/2020. She is a 39 y.o. female, who is being followed for reactive airway disease and allergic rhinoconjunctivitis. Her previous allergy office visit was on 01/30/2020 with Dr. Selena Batten. Today is a regular follow up visit.  Reactive airway disease Noted chest tightness, shortness of breath, coughing and wheezing for 3 years.  This started when she moved to [place] from [place]. Symptoms improved gradually. Uses albuterol on a rare occasion with good benefit.  Main triggers are allergies and infections.  Patient is a current tobacco user.  Today's spirometry was normal with 13% improvement in FEV1 post bronchodilator treatment.  Clinically feeling improved.    This is concerning for reactive airway disease.  Daily controller medication(s):  Start Singulair (montelukast) 10mg  daily at night.  Cautioned that in some children/adults can experience behavioral changes including hyperactivity, agitation, depression, sleep disturbances and suicidal ideations. These side effects are rare, but if you notice them you should notify me and discontinue Singulair (montelukast).  May use albuterol rescue inhaler 2 puffs every 4 to 6 hours as needed for shortness of breath, chest tightness, coughing, and wheezing. May use albuterol rescue inhaler 2 puffs 5 to 15 minutes prior to strenuous physical activities. Monitor frequency of use.   Repeat spirometry at next visit.  If symptoms not improved then will do a trial of ICS inhaler next.  Other allergic rhinitis Rhinoconjunctivitis symptoms for the past 3 years mainly in the  spring and fall.  Tried Claritin, Allegra and nasal sprays with some benefit.  No prior allergy/ENT evaluation.  Today's skin testing showed: Positive to weed, ragweed, trees, mold, dust mites, cat and cockroach.  Start environmental control measures as below.  May use over the counter antihistamines such as Zyrtec (cetirizine), Claritin (loratadine), Allegra (fexofenadine), or Xyzal (levocetirizine) daily as needed. May take twice a day if needed.  Start dymista (fluticasone + azelastine nasal spray combination) 1 spray per nostril twice a day. If it's not covered let 11-21-1990 know.   May use olopatadine eye drops 0.2% once a day as needed for itchy/watery eyes.  We will discuss allergy immunotherapy in more detail next visit - handout given.  Allergic conjunctivitis of both eyes  See assessment and plan as above for allergic rhinitis.  Return in about 2 months (around 03/31/2020).  Assessment and Plan: Janet Lee is a 39 y.o. female with: No problem-specific Assessment & Plan notes found for this encounter.  No follow-ups on file.  No orders of the defined types were placed in this encounter.  Lab Orders  No laboratory test(s) ordered today    Diagnostics: Spirometry:  Tracings reviewed. Her effort: {Blank single:19197::"Good reproducible efforts.","It was hard to get consistent efforts and there is a question as to whether this reflects a maximal maneuver.","Poor effort, data can not be interpreted."} FVC: ***L FEV1: ***L, ***% predicted FEV1/FVC ratio: ***% Interpretation: {Blank single:19197::"Spirometry consistent with mild obstructive disease","Spirometry consistent with moderate obstructive disease","Spirometry consistent with severe obstructive disease","Spirometry consistent with possible restrictive disease","Spirometry consistent with mixed obstructive and restrictive disease","Spirometry uninterpretable due to technique","Spirometry consistent with normal pattern","No  overt abnormalities noted given today's efforts"}.  Please  see scanned spirometry results for details.  Skin Testing: {Blank single:19197::"Select foods","Environmental allergy panel","Environmental allergy panel and select foods","Food allergy panel","None","Deferred due to recent antihistamines use"}. Positive test to: ***. Negative test to: ***.  Results discussed with patient/family.   Medication List:  Current Outpatient Medications  Medication Sig Dispense Refill  . Azelastine-Fluticasone 137-50 MCG/ACT SUSP Place 1 spray into the nose in the morning and at bedtime. 23 g 5  . montelukast (SINGULAIR) 10 MG tablet Take 1 tablet (10 mg total) by mouth at bedtime. 30 tablet 5  . Olopatadine HCl 0.2 % SOLN Apply 1 drop to eye daily as needed (itchy/watery eyes). 2.5 mL 5   No current facility-administered medications for this visit.   Allergies: No Known Allergies I reviewed her past medical history, social history, family history, and environmental history and no significant changes have been reported from her previous visit.  Review of Systems  Constitutional: Negative for appetite change, chills, fever and unexpected weight change.  HENT: Negative for congestion and rhinorrhea.   Eyes: Positive for itching.  Respiratory: Negative for cough, chest tightness, shortness of breath and wheezing.   Cardiovascular: Negative for chest pain.  Gastrointestinal: Negative for abdominal pain.  Genitourinary: Negative for difficulty urinating.  Skin: Negative for rash.  Allergic/Immunologic: Positive for environmental allergies.  Neurological: Negative for headaches.   Objective: There were no vitals taken for this visit. There is no height or weight on file to calculate BMI. Physical Exam Vitals and nursing note reviewed.  Constitutional:      Appearance: Normal appearance. She is well-developed.  HENT:     Head: Normocephalic and atraumatic.     Right Ear: External ear normal.      Left Ear: External ear normal.     Nose: Nose normal.     Mouth/Throat:     Mouth: Mucous membranes are moist.     Pharynx: Oropharynx is clear.  Eyes:     Conjunctiva/sclera: Conjunctivae normal.  Cardiovascular:     Rate and Rhythm: Normal rate and regular rhythm.     Heart sounds: Normal heart sounds. No murmur heard.  No friction rub. No gallop.   Pulmonary:     Effort: Pulmonary effort is normal.     Breath sounds: Normal breath sounds. No wheezing, rhonchi or rales.  Abdominal:     Palpations: Abdomen is soft.  Musculoskeletal:     Cervical back: Neck supple.  Skin:    General: Skin is warm.     Findings: No rash.  Neurological:     Mental Status: She is alert and oriented to person, place, and time.  Psychiatric:        Behavior: Behavior normal.    Previous notes and tests were reviewed. The plan was reviewed with the patient/family, and all questions/concerned were addressed.  It was my pleasure to see Sierah today and participate in her care. Please feel free to contact me with any questions or concerns.  Sincerely,  Wyline Mood, DO Allergy & Immunology  [facility] office: [phone] [facility] office: [phone]

## 2020-04-01 ENCOUNTER — Ambulatory Visit: Payer: BC Managed Care – PPO | Admitting: Allergy

## 2020-04-01 DIAGNOSIS — H1010 Acute atopic conjunctivitis, unspecified eye: Secondary | ICD-10-CM

## 2020-04-01 DIAGNOSIS — J4520 Mild intermittent asthma, uncomplicated: Secondary | ICD-10-CM

## 2020-12-08 ENCOUNTER — Ambulatory Visit (HOSPITAL_BASED_OUTPATIENT_CLINIC_OR_DEPARTMENT_OTHER): Payer: BC Managed Care – PPO | Admitting: Nurse Practitioner

## 2020-12-08 NOTE — Progress Notes (Deleted)
  Shawna Clamp, DNP, AGNP-c Primary Care Services ______________________________________________________________________  HPI Janet Lee is a 40 y.o. female presenting to [facility] today to establish care.   Patient Care Team: Deeann Saint, MD as PCP - General (Family Medicine)  Health Maintenance  Topic Date Due   Pneumococcal Vaccination (1 - PCV) Never done   HIV Screening  Never done   Hepatitis C Screening: USPSTF Recommendation to screen - Ages 51-79 yo.  Never done   Pap Smear  Never done   COVID-19 Vaccine (3 - Booster for Moderna series) 01/13/2020   Flu Shot  11/30/2020   Tetanus Vaccine  04/22/2024   HPV Vaccine  Aged Out     Concerns today: ***   Patient Active Problem List   Diagnosis Date Noted   Seasonal and perennial allergic rhinoconjunctivitis 03/31/2020   Other allergic rhinitis 01/30/2020   Reactive airway disease 01/30/2020   Allergic conjunctivitis of both eyes 01/30/2020    PHQ9 Today: No flowsheet data found. GAD7 Today: No flowsheet data found. ______________________________________________________________________ PMH No past medical history on file.  ROS All review of systems negative except what is listed in the HPI  PHYSICAL EXAM Physical Exam ______________________________________________________________________ ASSESSMENT AND PLAN Problem List Items Addressed This Visit   None   Education provided today during visit and on AVS for patient to review at home.  Diet and Exercise recommendations provided.  Current diagnoses and recommendations discussed. HM recommendations reviewed with recommendations.    Outpatient Encounter Medications as of 12/08/2020  Medication Sig   Azelastine-Fluticasone 137-50 MCG/ACT SUSP Place 1 spray into the nose in the morning and at bedtime.   montelukast (SINGULAIR) 10 MG tablet Take 1 tablet (10 mg total) by mouth at  bedtime.   Olopatadine HCl 0.2 % SOLN Apply 1 drop to eye daily as needed (itchy/watery eyes).   No facility-administered encounter medications on file as of 12/08/2020.    No follow-ups on file.  Time: ***minutes, >50% spent counseling, care coordination, chart review, and documentation.   Tollie Eth, DNP, AGNP-c

## 2021-01-21 ENCOUNTER — Ambulatory Visit (HOSPITAL_BASED_OUTPATIENT_CLINIC_OR_DEPARTMENT_OTHER): Payer: BC Managed Care – PPO | Admitting: Nurse Practitioner

## 2021-01-21 ENCOUNTER — Encounter (HOSPITAL_BASED_OUTPATIENT_CLINIC_OR_DEPARTMENT_OTHER): Payer: Self-pay

## 2021-03-23 ENCOUNTER — Ambulatory Visit
Admission: EM | Admit: 2021-03-23 | Discharge: 2021-03-23 | Disposition: A | Discharge status: Home / Self Care | Payer: BC Managed Care – PPO | Attending: Internal Medicine | Admitting: Internal Medicine

## 2021-03-23 ENCOUNTER — Other Ambulatory Visit: Payer: Self-pay

## 2021-03-23 DIAGNOSIS — R10A0 Flank pain, unspecified side: Secondary | ICD-10-CM

## 2021-03-23 DIAGNOSIS — R1031 Right lower quadrant pain: Secondary | ICD-10-CM

## 2021-03-23 DIAGNOSIS — R109 Unspecified abdominal pain: Secondary | ICD-10-CM | POA: Diagnosis not present

## 2021-03-23 LAB — POCT URINALYSIS DIP (MANUAL ENTRY)
Bilirubin, UA: NEGATIVE
Glucose, UA: NEGATIVE mg/dL
Ketones, POC UA: NEGATIVE mg/dL
Leukocytes, UA: NEGATIVE
Nitrite, UA: NEGATIVE
Spec Grav, UA: >=1.03 — AB (ref 1.010–1.025)
Urobilinogen, UA: 0.2 E.U./dL
pH, UA: 5.5 (ref 5.0–8.0)

## 2021-03-23 LAB — POCT URINE PREGNANCY: Preg Test, Ur: NEGATIVE

## 2021-03-23 MED ORDER — DICYCLOMINE HCL 20 MG PO TABS: 20.0000 mg | ORAL_TABLET | Freq: Two times a day (BID) | ORAL | 0 refills | Status: DC

## 2021-03-23 NOTE — Discharge Instructions (Addendum)
Not able to rule out kidney stone or colitis. Pt will need to have a ct or ultrasound completed.  Urine did not show any infection slight blood  Call your pcp to see if you can get in for out pt if not go to er  Take meds s needed

## 2021-03-23 NOTE — ED Provider Notes (Signed)
[facility]    CSN: [id number] Arrival date & time: 03/23/21  1106      History   Chief Complaint Chief Complaint  Patient presents with   Back Pain    HPI Janet Lee is a 40 y.o. female.   Rt side flank and lower abd pain for 5 weeks now intermit . States that it hurts more with movement. Denies any injury. No urinary sx. Normal bowl movements. Denies any fevers.  Has not seen anyone for this. Menstrual cycle due in 4 days    History reviewed. No pertinent past medical history.  Patient Active Problem List   Diagnosis Date Noted   Seasonal and perennial allergic rhinoconjunctivitis 03/31/2020   Other allergic rhinitis 01/30/2020   Reactive airway disease 01/30/2020   Allergic conjunctivitis of both eyes 01/30/2020    Past Surgical History:  Procedure Laterality Date   CESAREAN SECTION      OB History   No obstetric history on file.      Home Medications    Prior to Admission medications   Medication Sig Start Date End Date Taking? Authorizing Provider  dicyclomine (BENTYL) 20 MG tablet Take 1 tablet (20 mg total) by mouth 2 (two) times daily. 03/23/21  Yes Coralyn Mark, NP  albuterol (VENTOLIN HFA) 108 (90 Base) MCG/ACT inhaler Inhale into the lungs. 11/07/19   [provider]  Azelastine-Fluticasone 137-50 MCG/ACT SUSP Place 1 spray into the nose in the morning and at bedtime. 01/30/20   Ellamae Sia, DO  montelukast (SINGULAIR) 10 MG tablet Take 1 tablet (10 mg total) by mouth at bedtime. 01/30/20   Ellamae Sia, DO  Norgestimate-Ethinyl Estradiol Triphasic 0.18/0.215/0.25 MG-35 MCG tablet Take 1 tablet by mouth daily. 11/28/16   [provider]  Olopatadine HCl 0.2 % SOLN Apply 1 drop to eye daily as needed (itchy/watery eyes). 01/30/20   Ellamae Sia, DO    Family History Family History  Problem Relation Age of Onset   Heart disease Mother    Hypertension Mother    Alcohol abuse Father    Drug abuse Father     Heart disease Maternal Grandmother     Social History Social History   Tobacco Use   Smoking status: Every Day   Smokeless tobacco: Former  Substance Use Topics   Alcohol use: Yes   Drug use: Never     Allergies   American cockroach, Cat hair extract, Dust mite extract, Mixed ragweed, Molds & smuts, Tree extract, and Grass pollen(k-o-r-t-swt vern)   Review of Systems Review of Systems  Constitutional:  Negative for activity change and fever.  Respiratory: Negative.    Cardiovascular: Negative.   Gastrointestinal:  Positive for abdominal pain. Negative for constipation, diarrhea and nausea.  Genitourinary:  Positive for hematuria and pelvic pain. Negative for dysuria, frequency, urgency, vaginal bleeding, vaginal discharge and vaginal pain.  Musculoskeletal: Negative.   Skin: Negative.   Neurological: Negative.     Physical Exam Triage Vital Signs ED Triage Vitals  Enc Vitals Group     BP 03/23/21 1218 (!) 148/85     Pulse Rate 03/23/21 1218 79     Resp 03/23/21 1218 16     Temp 03/23/21 1218 99 F (37.2 C)     Temp Source 03/23/21 1218 Oral     SpO2 03/23/21 1218 98 %     Weight --      Height --      Head Circumference --  Peak Flow --      Pain Score 03/23/21 1217 5     Pain Loc --      Pain Edu? --      Excl. in GC? --    No data found.  Updated Vital Signs BP (!) 148/85 (BP Location: Left Arm)   Pulse 79   Temp 99 F (37.2 C) (Oral)   Resp 16   LMP 03/01/2021   SpO2 98%   Visual Acuity Right Eye Distance:   Left Eye Distance:   Bilateral Distance:    Right Eye Near:   Left Eye Near:    Bilateral Near:     Physical Exam Constitutional:      Appearance: Normal appearance. She is normal weight.  Eyes:     Pupils: Pupils are equal, round, and reactive to light.  Cardiovascular:     Rate and Rhythm: Normal rate.  Pulmonary:     Effort: Pulmonary effort is normal.  Abdominal:     General: Abdomen is flat. Bowel sounds are normal.      Tenderness: There is no right CVA tenderness, left CVA tenderness, guarding or rebound.  Musculoskeletal:        General: Normal range of motion.  Skin:    General: Skin is warm.  Neurological:     Mental Status: She is alert.     UC Treatments / Results  Labs (all labs ordered are listed, but only abnormal results are displayed) Labs Reviewed  POCT URINALYSIS DIP (MANUAL ENTRY) - Abnormal; Notable for the following components:      Result Value   Spec Grav, UA >=1.030 (*)    Blood, UA trace-lysed (*)    Protein Ur, POC trace (*)    All other components within normal limits  POCT URINE PREGNANCY    EKG   Radiology No results found.  Procedures Procedures (including critical care time)  Medications Ordered in UC Medications - No data to display  Initial Impression / Assessment and Plan / UC Course  I have reviewed the triage vital signs and the nursing notes.  Pertinent labs & imaging results that were available during my care of the patient were reviewed by me and considered in my medical decision making (see chart for details).    Not able to rule out kidney stone or colitis. Pt will need to have a ct or ultrasound completed.  Urine did not show any infection slight blood  Call your pcp to see if you can get in for out pt if not go to er  Take meds s needed  Final Clinical Impressions(s) / UC Diagnoses   Final diagnoses:  Flank pain  Right lower quadrant abdominal pain     Discharge Instructions      Not able to rule out kidney stone or colitis. Pt will need to have a ct or ultrasound completed.  Urine did not show any infection slight blood  Call your pcp to see if you can get in for out pt if not go to er  Take meds s needed       ED Prescriptions     Medication Sig Dispense Auth. Provider   dicyclomine (BENTYL) 20 MG tablet Take 1 tablet (20 mg total) by mouth 2 (two) times daily. 20 tablet Coralyn Mark, NP      PDMP not reviewed  this encounter.   Coralyn Mark, NP 03/23/21 1320

## 2021-03-23 NOTE — ED Triage Notes (Signed)
Pt present right side back/abdomen area. Pt state the pain is the median section of her back and stomach. Symptoms started five weeks ago.

## 2021-03-24 ENCOUNTER — Emergency Department (HOSPITAL_BASED_OUTPATIENT_CLINIC_OR_DEPARTMENT_OTHER)
Admission: EM | Admit: 2021-03-24 | Discharge: 2021-03-24 | Disposition: A | Discharge status: Home / Self Care | Payer: BC Managed Care – PPO | Attending: Emergency Medicine | Admitting: Emergency Medicine

## 2021-03-24 ENCOUNTER — Encounter (HOSPITAL_BASED_OUTPATIENT_CLINIC_OR_DEPARTMENT_OTHER): Payer: Self-pay

## 2021-03-24 ENCOUNTER — Emergency Department (HOSPITAL_BASED_OUTPATIENT_CLINIC_OR_DEPARTMENT_OTHER): Payer: BC Managed Care – PPO

## 2021-03-24 DIAGNOSIS — Z7951 Long term (current) use of inhaled steroids: Secondary | ICD-10-CM | POA: Insufficient documentation

## 2021-03-24 DIAGNOSIS — R197 Diarrhea, unspecified: Secondary | ICD-10-CM | POA: Diagnosis not present

## 2021-03-24 DIAGNOSIS — R10A0 Flank pain, unspecified side: Secondary | ICD-10-CM

## 2021-03-24 DIAGNOSIS — F419 Anxiety disorder, unspecified: Secondary | ICD-10-CM | POA: Insufficient documentation

## 2021-03-24 DIAGNOSIS — Z87891 Personal history of nicotine dependence: Secondary | ICD-10-CM | POA: Diagnosis not present

## 2021-03-24 DIAGNOSIS — J45909 Unspecified asthma, uncomplicated: Secondary | ICD-10-CM | POA: Diagnosis not present

## 2021-03-24 DIAGNOSIS — M546 Pain in thoracic spine: Secondary | ICD-10-CM | POA: Diagnosis not present

## 2021-03-24 DIAGNOSIS — R109 Unspecified abdominal pain: Secondary | ICD-10-CM | POA: Diagnosis not present

## 2021-03-24 LAB — CBC WITH DIFFERENTIAL/PLATELET
Abs Immature Granulocytes: 0.03 10*3/uL (ref 0.00–0.07)
Basophils Absolute: 0 10*3/uL (ref 0.0–0.1)
Basophils Relative: 0 %
Eosinophils Absolute: 0.1 10*3/uL (ref 0.0–0.5)
Eosinophils Relative: 1 %
HCT: 44.3 % (ref 36.0–46.0)
Hemoglobin: 14.8 g/dL (ref 12.0–15.0)
Immature Granulocytes: 0 %
Lymphocytes Relative: 14 %
Lymphs Abs: 1.2 10*3/uL (ref 0.7–4.0)
MCH: 31.4 pg (ref 26.0–34.0)
MCHC: 33.4 g/dL (ref 30.0–36.0)
MCV: 93.9 fL (ref 80.0–100.0)
Monocytes Absolute: 0.6 10*3/uL (ref 0.1–1.0)
Monocytes Relative: 7 %
Neutro Abs: 7.1 10*3/uL (ref 1.7–7.7)
Neutrophils Relative %: 78 %
Platelets: 203 10*3/uL (ref 150–400)
RBC: 4.72 MIL/uL (ref 3.87–5.11)
RDW: 12.3 % (ref 11.5–15.5)
WBC: 9.1 10*3/uL (ref 4.0–10.5)
nRBC: 0 % (ref 0.0–0.2)

## 2021-03-24 LAB — URINALYSIS, ROUTINE W REFLEX MICROSCOPIC
Bilirubin Urine: NEGATIVE
Glucose, UA: NEGATIVE mg/dL
Hgb urine dipstick: NEGATIVE
Ketones, ur: >80 mg/dL — AB
Leukocytes,Ua: NEGATIVE
Nitrite: NEGATIVE
Protein, ur: NEGATIVE mg/dL
Specific Gravity, Urine: 1.018 (ref 1.005–1.030)
pH: 5 (ref 5.0–8.0)

## 2021-03-24 LAB — COMPREHENSIVE METABOLIC PANEL
ALT: 13 U/L (ref 0–44)
AST: 12 U/L — ABNORMAL LOW (ref 15–41)
Albumin: 4.7 g/dL (ref 3.5–5.0)
Alkaline Phosphatase: 39 U/L (ref 38–126)
Anion gap: 9 (ref 5–15)
BUN: 9 mg/dL (ref 6–20)
CO2: 24 mmol/L (ref 22–32)
Calcium: 9.7 mg/dL (ref 8.9–10.3)
Chloride: 103 mmol/L (ref 98–111)
Creatinine, Ser: 0.67 mg/dL (ref 0.44–1.00)
GFR, Estimated: >60 mL/min (ref >60)
Glucose, Bld: 91 mg/dL (ref 70–99)
Potassium: 3.6 mmol/L (ref 3.5–5.1)
Sodium: 136 mmol/L (ref 135–145)
Total Bilirubin: 0.8 mg/dL (ref 0.3–1.2)
Total Protein: 7.6 g/dL (ref 6.5–8.1)

## 2021-03-24 LAB — PREGNANCY, URINE: Preg Test, Ur: NEGATIVE

## 2021-03-24 LAB — LIPASE, BLOOD: Lipase: 14 U/L (ref 11–51)

## 2021-03-24 MED ORDER — IOHEXOL 300 MG/ML  SOLN
80.0000 mL | Freq: Once | INTRAMUSCULAR | Status: AC | PRN
  Administered 2021-03-24: 80 mL via INTRAVENOUS

## 2021-03-24 MED ORDER — METHOCARBAMOL 500 MG PO TABS
500.0000 mg | ORAL_TABLET | Freq: Four times a day (QID) | ORAL | 0 refills | Status: DC
Start: 2021-03-24 — End: 2022-11-09

## 2021-03-24 MED ORDER — LIDOCAINE 5 % EX PTCH
1.0000 | MEDICATED_PATCH | CUTANEOUS | 0 refills | Status: DC
Start: 2021-03-24 — End: 2022-11-09

## 2021-03-24 NOTE — ED Notes (Signed)
ED Provider at bedside. 

## 2021-03-24 NOTE — Discharge Instructions (Signed)
Please read and follow all provided instructions.  Your diagnoses today include:  1. Flank pain     Tests performed today include: Blood cell counts and platelets Kidney and liver function tests Pancreas function test (called lipase) Urine test to look for infection A blood or urine test for pregnancy (women only) CT scan of your abdomen and pelvis: Does not show any serious problems or explanations for your pain today Vital signs. See below for your results today.   Medications prescribed:  Robaxin (methocarbamol) - muscle relaxer medication  DO NOT drive or perform any activities that require you to be awake and alert because this medicine can make you drowsy.   Take any prescribed medications only as directed.  Home care instructions:  Follow any educational materials contained in this packet.  Follow-up instructions: Please follow-up with your primary care provider as planned for further evaluation of your symptoms.    Return instructions:  SEEK IMMEDIATE MEDICAL ATTENTION IF: The pain does not go away or becomes severe  A temperature above 101F develops  Repeated vomiting occurs (multiple episodes)  The pain becomes localized to portions of the abdomen. The right side could possibly be appendicitis. In an adult, the left lower portion of the abdomen could be colitis or diverticulitis.  Blood is being passed in stools or vomit (bright red or black tarry stools)  You develop chest pain, difficulty breathing, dizziness or fainting, or become confused, poorly responsive, or inconsolable (young children) If you have any other emergent concerns regarding your health  Additional Information: Abdominal (belly) pain can be caused by many things. Your caregiver performed an examination and possibly ordered blood/urine tests and imaging (CT scan, x-rays, ultrasound). Many cases can be observed and treated at home after initial evaluation in the emergency department. Even though you  are being discharged home, abdominal pain can be unpredictable. Therefore, you need a repeated exam if your pain does not resolve, returns, or worsens. Most patients with abdominal pain don't have to be admitted to the hospital or have surgery, but serious problems like appendicitis and gallbladder attacks can start out as nonspecific pain. Many abdominal conditions cannot be diagnosed in one visit, so follow-up evaluations are very important.  Your vital signs today were: BP (!) 145/103   Pulse 63   Temp 100.1 F (37.8 C) (Oral)   Resp 14   LMP 03/01/2021 (Exact Date)   SpO2 93%  If your blood pressure (bp) was elevated above 135/85 this visit, please have this repeated by your doctor within one month. --------------

## 2021-03-24 NOTE — ED Provider Notes (Signed)
[facility] Provider Note   CSN: [id number] Arrival date & time: 03/24/21  1017     History Chief Complaint  Patient presents with   Flank Pain    Janet Lee is a 40 y.o. female.  Patient with history of cesarean section presents the emergency department for evaluation of right-sided flank and middle back pain ongoing over the past 6 weeks.  Pain is worse with movement and palpation.  It is worse at night.  Is not changed with eating or drinking.  She states that it is a constant kind of pain.  She does not have any associated nausea, vomiting, diarrhea or constipation.  No associated fever or shortness of breath.  It does not worse with taking a deep breath.  Patient was seen at urgent care yesterday.  She had a UA which was negative.  She was told that they could not rule out a problem with the intestines or with a kidney.  She was given Bentyl.  This caused her to have diarrhea in the past 24 hours only.  No urinary symptoms.  Patient has been taking over-the-counter medications without improvement.  Patient states that she feels anxious because she looked up what her symptoms could mean in [employer].      History reviewed. No pertinent past medical history.  Patient Active Problem List   Diagnosis Date Noted   Seasonal and perennial allergic rhinoconjunctivitis 03/31/2020   Other allergic rhinitis 01/30/2020   Reactive airway disease 01/30/2020   Allergic conjunctivitis of both eyes 01/30/2020    Past Surgical History:  Procedure Laterality Date   CESAREAN SECTION       OB History   No obstetric history on file.     Family History  Problem Relation Age of Onset   Heart disease Mother    Hypertension Mother    Alcohol abuse Father    Drug abuse Father    Heart disease Maternal Grandmother     Social History   Tobacco Use   Smoking status: Every Day   Smokeless tobacco: Former  Substance Use Topics   Alcohol use: Yes    Drug use: Never    Home Medications Prior to Admission medications   Medication Sig Start Date End Date Taking? Authorizing Provider  albuterol (VENTOLIN HFA) 108 (90 Base) MCG/ACT inhaler Inhale into the lungs. 11/07/19  Yes [provider]  dicyclomine (BENTYL) 20 MG tablet Take 1 tablet (20 mg total) by mouth 2 (two) times daily. 03/23/21  Yes Marney Setting, NP  Azelastine-Fluticasone 137-50 MCG/ACT SUSP Place 1 spray into the nose in the morning and at bedtime. Patient not taking: Reported on 03/24/2021 01/30/20   Garnet Sierras, DO  montelukast (SINGULAIR) 10 MG tablet Take 1 tablet (10 mg total) by mouth at bedtime. Patient not taking: Reported on 03/24/2021 01/30/20   Garnet Sierras, DO  Olopatadine HCl 0.2 % SOLN Apply 1 drop to eye daily as needed (itchy/watery eyes). Patient not taking: Reported on 03/24/2021 01/30/20   Garnet Sierras, DO    Allergies    American cockroach, Cat hair extract, Dust mite extract, Mixed ragweed, Molds & smuts, Tree extract, and Grass pollen(k-o-r-t-swt vern)  Review of Systems   Review of Systems  Constitutional:  Negative for fever.  HENT:  Negative for rhinorrhea and sore throat.   Eyes:  Negative for redness.  Respiratory:  Negative for cough.   Cardiovascular:  Negative for chest pain.  Gastrointestinal:  Positive for  diarrhea (just today). Negative for abdominal pain, nausea and vomiting.  Genitourinary:  Positive for flank pain. Negative for dysuria, frequency, hematuria and urgency.  Musculoskeletal:  Negative for myalgias.  Skin:  Negative for rash.  Neurological:  Negative for headaches.   Physical Exam Updated Vital Signs BP 136/78   Pulse 62   Temp 100.1 F (37.8 C) (Oral)   Resp 15   LMP 03/01/2021 (Exact Date)   SpO2 100%   Physical Exam Vitals and nursing note reviewed.  Constitutional:      General: She is not in acute distress.    Appearance: She is well-developed.  HENT:     Head: Normocephalic and atraumatic.      Right Ear: External ear normal.     Left Ear: External ear normal.     Nose: Nose normal.  Eyes:     Conjunctiva/sclera: Conjunctivae normal.  Cardiovascular:     Rate and Rhythm: Normal rate and regular rhythm.     Heart sounds: No murmur heard. Pulmonary:     Effort: No respiratory distress.     Breath sounds: No wheezing, rhonchi or rales.  Abdominal:     Palpations: Abdomen is soft.     Tenderness: There is no abdominal tenderness. There is no guarding or rebound.  Musculoskeletal:        General: Tenderness present.     Cervical back: Normal range of motion and neck supple.     Right lower leg: No edema.     Left lower leg: No edema.     Comments: Patient with tenderness to palpation over the right lateral abdomen and posteriorly over the lower ribs.  No skin changes.  Skin:    General: Skin is warm and dry.     Findings: No rash.  Neurological:     General: No focal deficit present.     Mental Status: She is alert. Mental status is at baseline.     Motor: No weakness.  Psychiatric:        Mood and Affect: Mood is anxious.    ED Results / Procedures / Treatments   Labs (all labs ordered are listed, but only abnormal results are displayed) Labs Reviewed  URINALYSIS, ROUTINE W REFLEX MICROSCOPIC - Abnormal; Notable for the following components:      Result Value   Ketones, ur >80 (*)    All other components within normal limits  COMPREHENSIVE METABOLIC PANEL - Abnormal; Notable for the following components:   AST 12 (*)    All other components within normal limits  PREGNANCY, URINE  CBC WITH DIFFERENTIAL/PLATELET  LIPASE, BLOOD    EKG None  Radiology CT ABDOMEN PELVIS W CONTRAST  Result Date: 03/24/2021 CLINICAL DATA:  Left flank pain and fever. EXAM: CT ABDOMEN AND PELVIS WITH CONTRAST TECHNIQUE: Multidetector CT imaging of the abdomen and pelvis was performed using the standard protocol following bolus administration of intravenous contrast. CONTRAST:   60mL OMNIPAQUE IOHEXOL 300 MG/ML  SOLN COMPARISON:  None. FINDINGS: Lower chest: No acute abnormality. Hepatobiliary: No focal liver abnormality is seen. No gallstones, gallbladder wall thickening, or biliary dilatation. Pancreas: Unremarkable. No pancreatic ductal dilatation or surrounding inflammatory changes. Spleen: Normal in size without focal abnormality. Adrenals/Urinary Tract: Adrenal glands are unremarkable. Kidneys are normal, without renal calculi, focal lesion, or hydronephrosis. Bladder is unremarkable. Stomach/Bowel: Stomach is within normal limits. Appendix appears normal. No evidence of bowel wall thickening, distention, or inflammatory changes. Vascular/Lymphatic: Mild aortic atherosclerosis. No enlarged abdominal or pelvic lymph  nodes. Reproductive: Status post hysterectomy. No adnexal masses. Other: No abdominal wall hernia or abnormality. Trace free fluid in the pelvis, likely physiologic. Musculoskeletal: No acute or significant osseous findings. IMPRESSION: 1. No acute findings in the abdomen or pelvis. 2. Mild aortic Atherosclerosis (ICD10-I70.0). Electronically Signed   By: Yetta Glassman M.D.   On: 03/24/2021 14:20    Procedures Procedures   Medications Ordered in ED Medications - No data to display  ED Course  I have reviewed the triage vital signs and the nursing notes.  Pertinent labs & imaging results that were available during my care of the patient were reviewed by me and considered in my medical decision making (see chart for details).  Patient seen and examined. Work-up initiated.   Plan: Labs, UA, CT of the abdomen and pelvis.  Patient states that it was recommended that she come to the emergency department for further work-up if symptoms got worse or do not improve.  Vital signs reviewed and are as follows: BP 136/78   Pulse 62   Temp 100.1 F (37.8 C) (Oral)   Resp 15   LMP 03/01/2021 (Exact Date)   SpO2 100%   2:44 PM patient updated on results.  She  seems relieved that her CT scan looks okay and labs are reassuring.  Will give prescription for Lidoderm patches and Robaxin to use at bedtime.  Discussed that is up to her if she would like to continue Bentyl.  She will follow-up with her PCP for further evaluation.  The patient was urged to return to the Emergency Department immediately with worsening of current symptoms, worsening abdominal pain, persistent vomiting, blood noted in stools, fever, or any other concerns. The patient verbalized understanding.      MDM Rules/Calculators/A&P                           Flank pain: Ongoing for several weeks.  Unclear etiology.  May be musculoskeletal in nature.  Vitals are stable, no fever. Labs reassuring. Imaging CT performed after discussion with patient, fortunately no acute findings. No signs of dehydration, patient is tolerating PO's. Lungs are clear and no signs suggestive of PNA. Low concern for appendicitis, cholecystitis, pancreatitis, ruptured viscus, UTI, kidney stone, aortic dissection, aortic aneurysm or other emergent abdominal etiology. Supportive therapy indicated with return if symptoms worsen.   Final Clinical Impression(s) / ED Diagnoses Final diagnoses:  Flank pain    Rx / DC Orders ED Discharge Orders          Ordered    lidocaine (LIDODERM) 5 %  Every 24 hours        03/24/21 1443    methocarbamol (ROBAXIN) 500 MG tablet  4 times daily        03/24/21 1443             Carlisle Cater, PA-C 03/24/21 1445    Charlesetta Shanks, MD 03/26/21 1606

## 2021-03-24 NOTE — ED Triage Notes (Signed)
She c/o persistent right flank pain x 5 weeks. She was seen at Urgent Care yesterday, had a u/a which was negative for infection and was prescribed an intestinal antispasmotic. She states that Urgent Care provider suggested CT vs. U/S for potential elucidation of this problem.

## 2021-06-22 LAB — HM MAMMOGRAPHY

## 2022-05-17 ENCOUNTER — Other Ambulatory Visit: Payer: BC Managed Care – PPO

## 2022-11-09 ENCOUNTER — Ambulatory Visit (INDEPENDENT_AMBULATORY_CARE_PROVIDER_SITE_OTHER): Payer: BC Managed Care – PPO | Admitting: Internal Medicine

## 2022-11-09 ENCOUNTER — Encounter: Payer: Self-pay | Admitting: Internal Medicine

## 2022-11-09 VITALS — BP 130/84 | HR 67 | Temp 98.4°F | Ht 61.0 in | Wt 163.0 lb

## 2022-11-09 DIAGNOSIS — Z131 Encounter for screening for diabetes mellitus: Secondary | ICD-10-CM

## 2022-11-09 DIAGNOSIS — Z1159 Encounter for screening for other viral diseases: Secondary | ICD-10-CM

## 2022-11-09 DIAGNOSIS — Z114 Encounter for screening for human immunodeficiency virus [HIV]: Secondary | ICD-10-CM | POA: Diagnosis not present

## 2022-11-09 DIAGNOSIS — Z0000 Encounter for general adult medical examination without abnormal findings: Secondary | ICD-10-CM | POA: Diagnosis not present

## 2022-11-09 DIAGNOSIS — Z13220 Encounter for screening for lipoid disorders: Secondary | ICD-10-CM

## 2022-11-09 NOTE — Progress Notes (Signed)
New Patient Office Visit     CC/Reason for Visit: Annual preventive exam, establish care Previous PCP: Caffie Damme at [facility] Last Visit: 2022  HPI: Janet Lee is a 42 y.o. female who is coming in today for the above mentioned reasons.  No past medical history of significance.  She is a professor at [employer].  She has 2 children ages 65 and 23.  She is a former smoker who quit in February 2023, occasional alcohol use, no allergies, her past surgical history is significant for 2 C-sections both her mother and maternal grandmother have had coronary artery disease.  She states she had mammogram and Pap smears in February 2023.  Will obtain records.  She has no acute concerns or complaints.   Past Medical/Surgical History: No past medical history on file.  Past Surgical History:  Procedure Laterality Date   CESAREAN SECTION      Social History:  reports that she has quit smoking. Her smoking use included cigarettes. She has quit using smokeless tobacco. She reports current alcohol use. She reports that she does not use drugs.  Allergies: Allergies  Allergen Reactions   American Cockroach Shortness Of Breath   Cat Hair Extract Hives, Itching, Shortness Of Breath and Swelling   Dust Mite Extract Rash and Shortness Of Breath   Mixed Ragweed Shortness Of Breath and Swelling   Molds & Smuts Cough, Itching and Shortness Of Breath   Tree Extract Cough and Shortness Of Breath   Grass Pollen(K-O-R-T-Swt Vern) Itching and Rash    Family History:  Family History  Problem Relation Age of Onset   Heart disease Mother    Hypertension Mother    Alcohol abuse Father    Drug abuse Father    Heart disease Maternal Grandmother      Current Outpatient Medications:    albuterol (VENTOLIN HFA) 108 (90 Base) MCG/ACT inhaler, Inhale into the lungs., Disp: , Rfl:    dicyclomine (BENTYL) 20 MG tablet, Take 1 tablet (20 mg total) by mouth 2 (two) times  daily., Disp: 20 tablet, Rfl: 0  Review of Systems:  Negative except as indicated in HPI.   Physical Exam: Vitals:   11/09/22 1500  BP: 130/84  Pulse: 67  Temp: 98.4 F (36.9 C)  TempSrc: Oral  SpO2: 100%  Weight: 163 lb (73.9 kg)  Height: 5\' 1"  (1.549 m)   Body mass index is 30.8 kg/m.  Physical Exam Vitals reviewed.  Constitutional:      General: She is not in acute distress.    Appearance: Normal appearance. She is not ill-appearing, toxic-appearing or diaphoretic.  HENT:     Head: Normocephalic.     Right Ear: Tympanic membrane, ear canal and external ear normal. There is no impacted cerumen.     Left Ear: Tympanic membrane, ear canal and external ear normal. There is no impacted cerumen.     Nose: Nose normal.     Mouth/Throat:     Mouth: Mucous membranes are moist.     Pharynx: Oropharynx is clear. No oropharyngeal exudate or posterior oropharyngeal erythema.  Eyes:     General: No scleral icterus.       Right eye: No discharge.        Left eye: No discharge.     Conjunctiva/sclera: Conjunctivae normal.     Pupils: Pupils are equal, round, and reactive to light.  Neck:     Vascular: No carotid bruit.  Cardiovascular:  Rate and Rhythm: Normal rate and regular rhythm.     Pulses: Normal pulses.     Heart sounds: Normal heart sounds.  Pulmonary:     Effort: Pulmonary effort is normal. No respiratory distress.     Breath sounds: Normal breath sounds.  Abdominal:     General: Abdomen is flat. Bowel sounds are normal.     Palpations: Abdomen is soft.  Musculoskeletal:        General: Normal range of motion.     Cervical back: Normal range of motion.  Skin:    General: Skin is warm and dry.  Neurological:     General: No focal deficit present.     Mental Status: She is alert and oriented to person, place, and time. Mental status is at baseline.  Psychiatric:        Mood and Affect: Mood normal.        Behavior: Behavior normal.        Thought Content:  Thought content normal.        Judgment: Judgment normal.     Flowsheet Row Office Visit from 11/09/2022 in [facility]  PHQ-9 Total Score 0      \   Impression and Plan:  Encounter for preventive health examination -     CBC with Differential/Platelet; Future -     Comprehensive metabolic panel; Future -     Hemoglobin A1c; Future -     Lipid panel; Future -     TSH; Future -     Vitamin B12; Future -     VITAMIN D 25 Hydroxy (Vit-D Deficiency, Fractures); Future  Encounter for hepatitis C screening test for low risk patient -     Hepatitis C antibody; Future  Encounter for screening for HIV -     HIV Antibody (routine testing w rflx); Future    -Recommend routine eye and dental care. -Healthy lifestyle discussed in detail. -Labs to be updated today. -Prostate cancer screening: N/A Health Maintenance  Topic Date Due   HIV Screening  Never done   Hepatitis C Screening  Never done   COVID-19 Vaccine (3 - 2023-24 season) 12/31/2021   Pap Smear  02/09/2023*   Flu Shot  12/01/2022   DTaP/Tdap/Td vaccine (3 - Td or Tdap) 04/22/2024   HPV Vaccine  Aged Out  *Topic was postponed. The date shown is not the original due date.       Janet Jan, MD [facility]

## 2022-11-10 LAB — CBC WITH DIFFERENTIAL/PLATELET
Basophils Absolute: 0.1 10*3/uL (ref 0.0–0.1)
Basophils Relative: 1 % (ref 0.0–3.0)
Eosinophils Absolute: 0.3 10*3/uL (ref 0.0–0.7)
Eosinophils Relative: 4 % (ref 0.0–5.0)
HCT: 41.5 % (ref 36.0–46.0)
Hemoglobin: 13.4 g/dL (ref 12.0–15.0)
Lymphocytes Relative: 29.3 % (ref 12.0–46.0)
Lymphs Abs: 1.9 10*3/uL (ref 0.7–4.0)
MCHC: 32.3 g/dL (ref 30.0–36.0)
MCV: 96 fl (ref 78.0–100.0)
Monocytes Absolute: 0.7 10*3/uL (ref 0.1–1.0)
Monocytes Relative: 10.3 % (ref 3.0–12.0)
Neutro Abs: 3.5 10*3/uL (ref 1.4–7.7)
Neutrophils Relative %: 55.4 % (ref 43.0–77.0)
Platelets: 206 10*3/uL (ref 150.0–400.0)
RBC: 4.32 Mil/uL (ref 3.87–5.11)
RDW: 13 % (ref 11.5–15.5)
WBC: 6.4 10*3/uL (ref 4.0–10.5)

## 2022-11-10 LAB — LIPID PANEL
Cholesterol: 197 mg/dL (ref 0–200)
HDL: 49.8 mg/dL (ref >39.00)
LDL Cholesterol: 128 mg/dL — ABNORMAL HIGH (ref 0–99)
NonHDL: 147.22
Total CHOL/HDL Ratio: 4
Triglycerides: 97 mg/dL (ref 0.0–149.0)
VLDL: 19.4 mg/dL (ref 0.0–40.0)

## 2022-11-10 LAB — COMPREHENSIVE METABOLIC PANEL
ALT: 13 U/L (ref 0–35)
AST: 14 U/L (ref 0–37)
Albumin: 4.1 g/dL (ref 3.5–5.2)
Alkaline Phosphatase: 38 U/L — ABNORMAL LOW (ref 39–117)
BUN: 13 mg/dL (ref 6–23)
CO2: 29 mEq/L (ref 19–32)
Calcium: 9.2 mg/dL (ref 8.4–10.5)
Chloride: 104 mEq/L (ref 96–112)
Creatinine, Ser: 0.7 mg/dL (ref 0.40–1.20)
GFR: 106.64 mL/min (ref >60.00)
Glucose, Bld: 82 mg/dL (ref 70–99)
Potassium: 4.1 mEq/L (ref 3.5–5.1)
Sodium: 138 mEq/L (ref 135–145)
Total Bilirubin: 0.3 mg/dL (ref 0.2–1.2)
Total Protein: 6.7 g/dL (ref 6.0–8.3)

## 2022-11-10 LAB — HEMOGLOBIN A1C: Hgb A1c MFr Bld: 5.2 % (ref 4.6–6.5)

## 2022-11-10 LAB — HIV ANTIBODY (ROUTINE TESTING W REFLEX): HIV 1&2 Ab, 4th Generation: NONREACTIVE

## 2022-11-10 LAB — TSH: TSH: 1.24 u[IU]/mL (ref 0.35–5.50)

## 2022-11-10 LAB — VITAMIN D 25 HYDROXY (VIT D DEFICIENCY, FRACTURES): VITD: 27.72 ng/mL — ABNORMAL LOW (ref 30.00–100.00)

## 2022-11-10 LAB — HEPATITIS C ANTIBODY: Hepatitis C Ab: NONREACTIVE

## 2022-11-10 LAB — VITAMIN B12: Vitamin B-12: 225 pg/mL (ref 211–911)

## 2022-11-14 ENCOUNTER — Other Ambulatory Visit: Payer: Self-pay | Admitting: Internal Medicine

## 2022-11-14 ENCOUNTER — Encounter: Payer: Self-pay | Admitting: Internal Medicine

## 2022-11-14 DIAGNOSIS — E559 Vitamin D deficiency, unspecified: Secondary | ICD-10-CM

## 2022-11-14 DIAGNOSIS — E785 Hyperlipidemia, unspecified: Secondary | ICD-10-CM | POA: Insufficient documentation

## 2022-11-14 DIAGNOSIS — E782 Mixed hyperlipidemia: Secondary | ICD-10-CM

## 2022-11-14 MED ORDER — VITAMIN D (ERGOCALCIFEROL) 1.25 MG (50000 UNIT) PO CAPS: 50000.0000 [IU] | ORAL_CAPSULE | ORAL | 0 refills | Status: AC

## 2022-12-03 IMAGING — CT CT ABD-PELV W/ CM
2 of 4 series · 17 of 46 positions shown, 19 images · IV contrast (APPLIED)
Comparison: None.

CLINICAL DATA: Left flank pain and fever.

EXAM:
CT ABDOMEN AND PELVIS WITH CONTRAST
TECHNIQUE: Multidetector CT imaging of the abdomen and pelvis was performed
using the standard protocol following bolus administration of
intravenous contrast.
CONTRAST:  80mL OMNIPAQUE IOHEXOL 300 MG/ML  SOLN

[Series 2: abd pel w · axial · 0.76mm/px · z∈[-330,+34]mm · 14 of 81 slices shown, 16 images]
[im 4/81  soft-tissue]
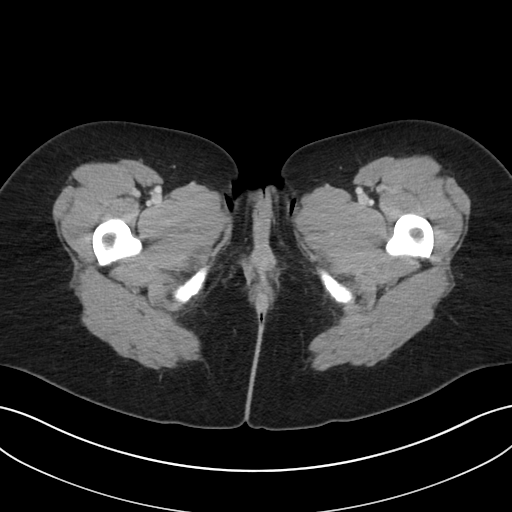
[im 4/81  bone]
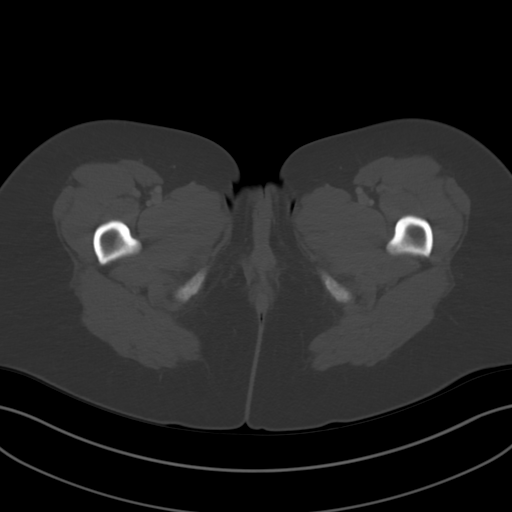
[im 10/81  soft-tissue]
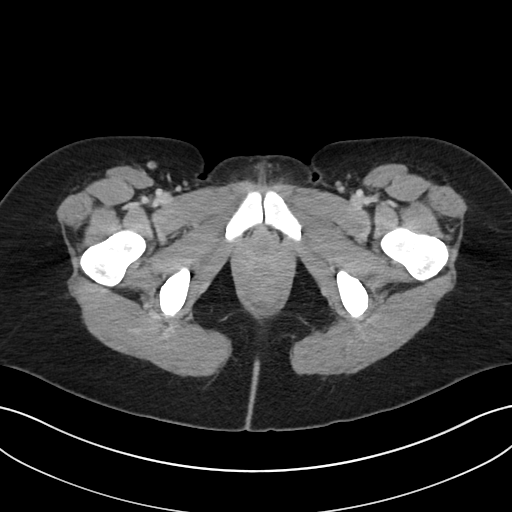
[im 16/81  soft-tissue]
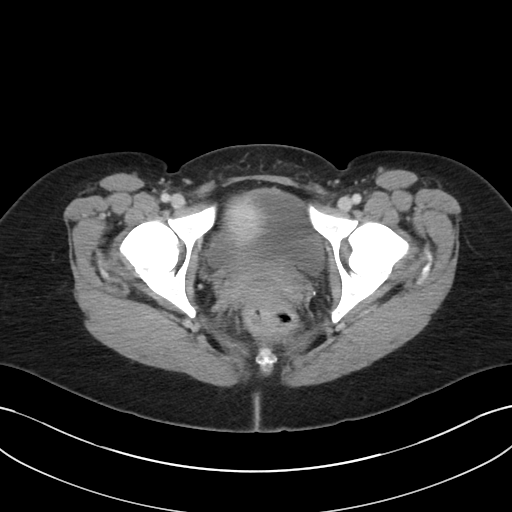
[im 22/81  soft-tissue]
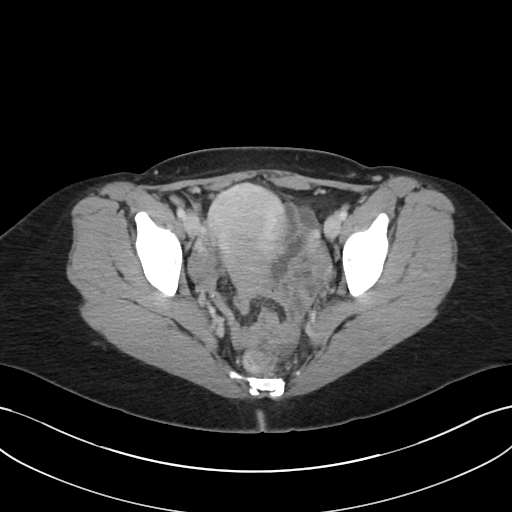
[im 28/81  soft-tissue]
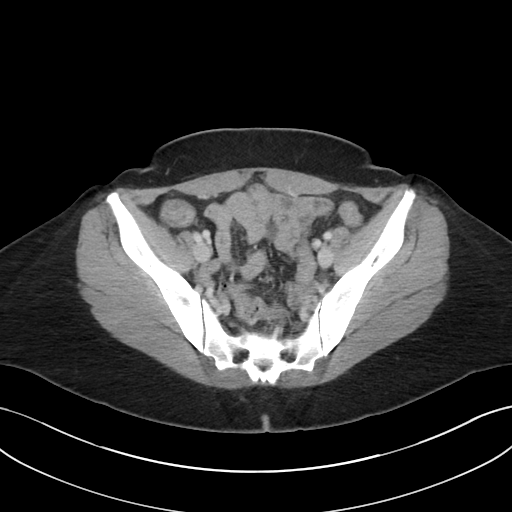
[im 31/81  soft-tissue]
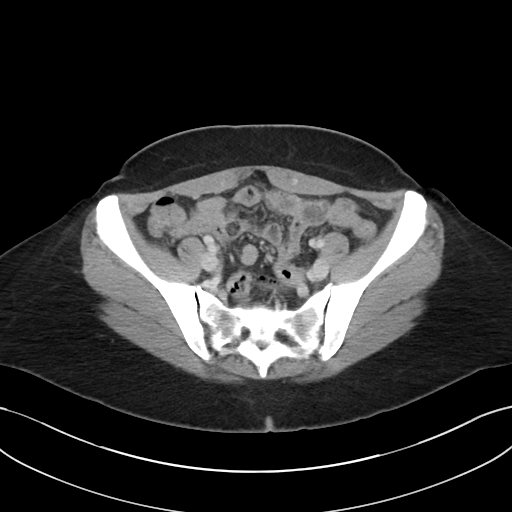
[im 37/81  soft-tissue]
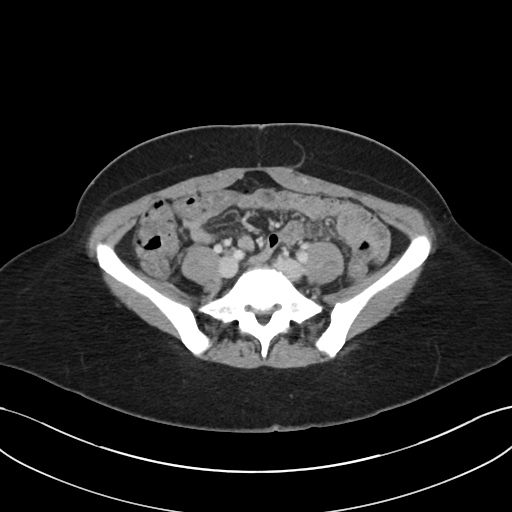
[im 44/81  soft-tissue]
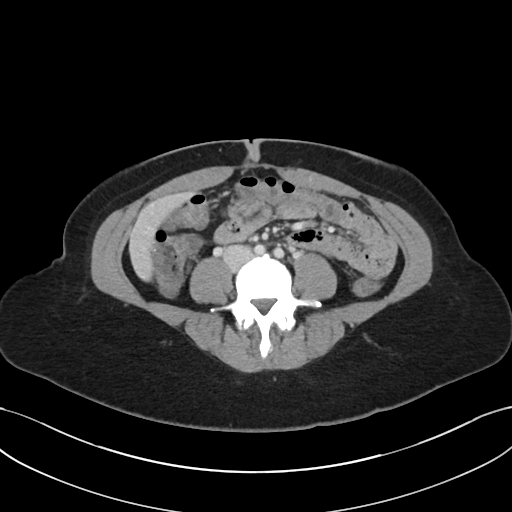
[im 50/81  soft-tissue]
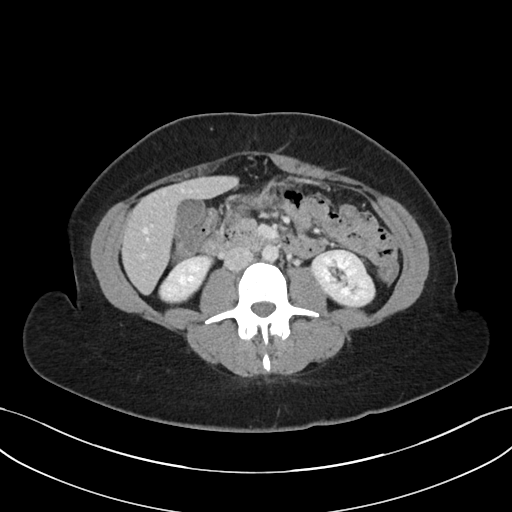
[im 50/81  bone]
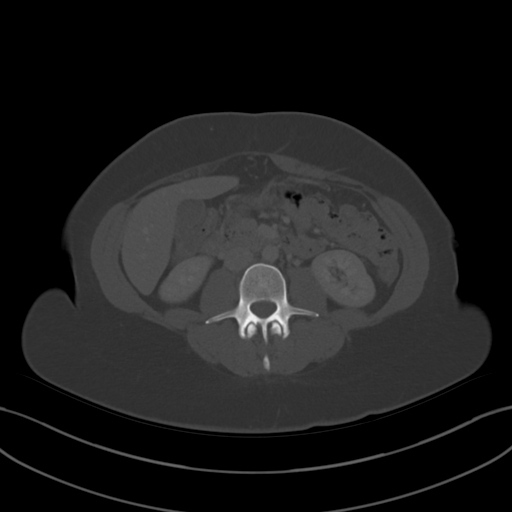
[im 53/81  soft-tissue]
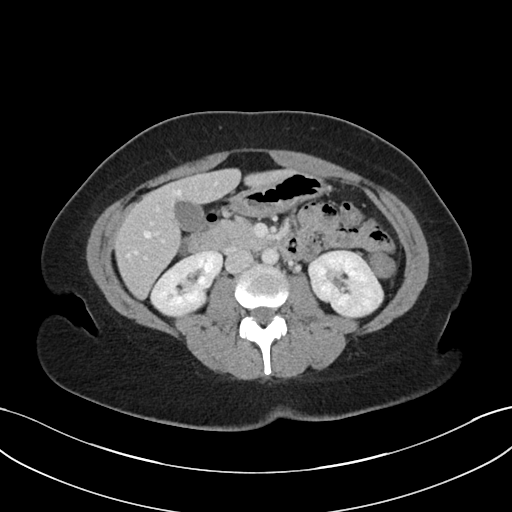
[im 59/81  soft-tissue]
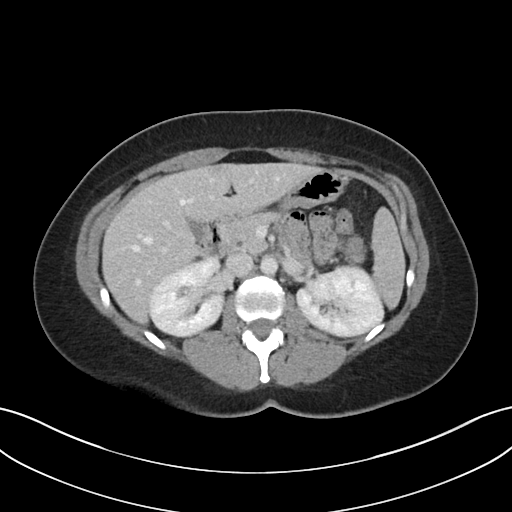
[im 65/81  soft-tissue]
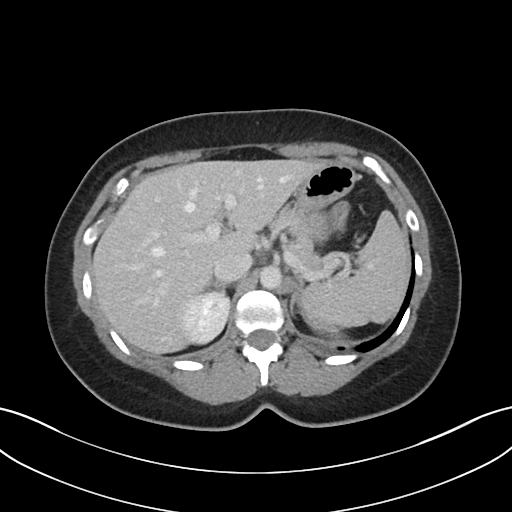
[im 71/81  soft-tissue]
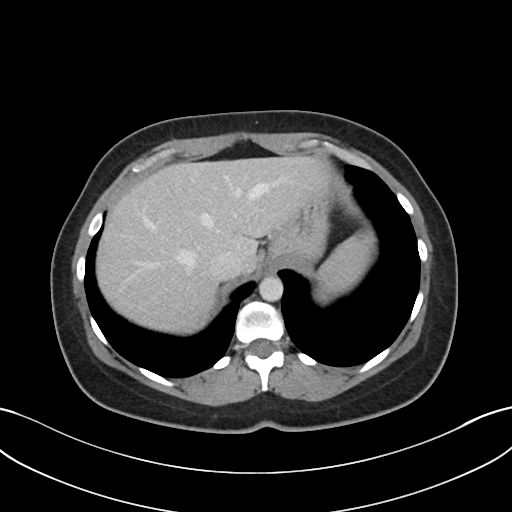
[im 77/81  soft-tissue]
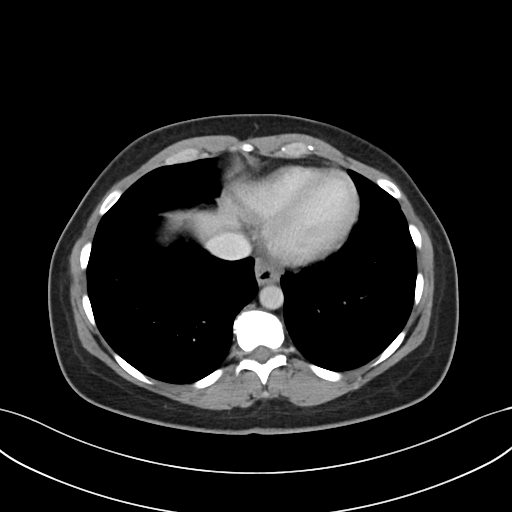

[Series 5: coronal · coronal · 0.76mm/px · 3 of 88 slices shown]
[im 30/88  soft-tissue]
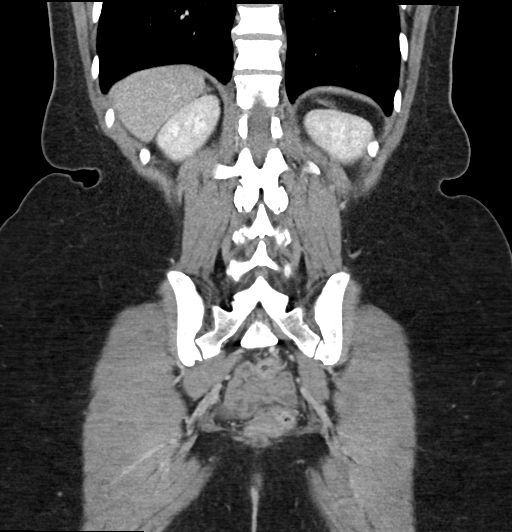
[im 39/88  soft-tissue]
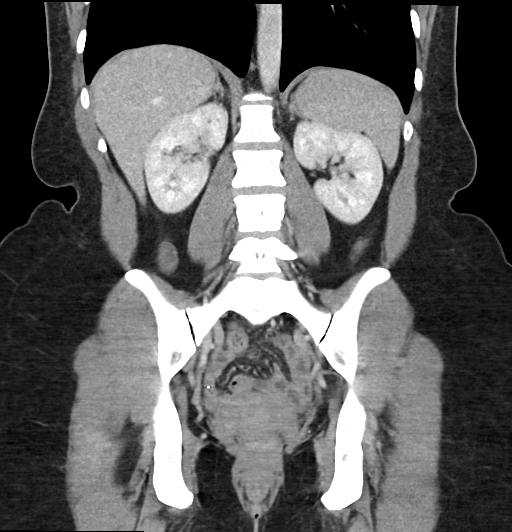
[im 49/88  soft-tissue]
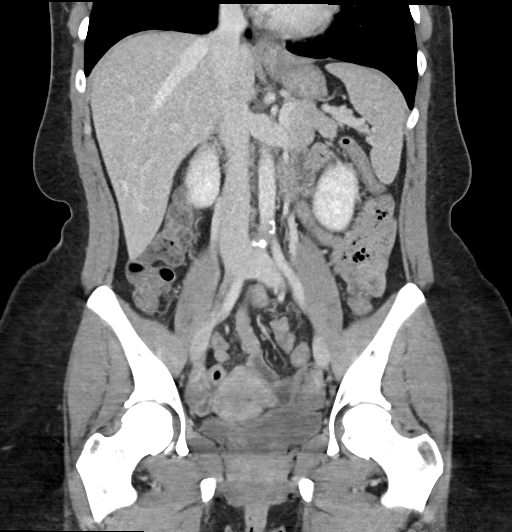

[17 of 46 positions shown; findings below may reference images not displayed]

FINDINGS: Lower chest: No acute abnormality.

Hepatobiliary: No focal liver abnormality is seen. No gallstones,
gallbladder wall thickening, or biliary dilatation.

Pancreas: Unremarkable. No pancreatic ductal dilatation or
surrounding inflammatory changes.

Spleen: Normal in size without focal abnormality.

Adrenals/Urinary Tract: Adrenal glands are unremarkable. Kidneys are
normal, without renal calculi, focal lesion, or hydronephrosis.
Bladder is unremarkable.

Stomach/Bowel: Stomach is within normal limits. Appendix appears
normal. No evidence of bowel wall thickening, distention, or
inflammatory changes.

Vascular/Lymphatic: Mild aortic atherosclerosis. No enlarged
abdominal or pelvic lymph nodes.

Reproductive: Status post hysterectomy. No adnexal masses.

Other: No abdominal wall hernia or abnormality. Trace free fluid in
the pelvis, likely physiologic.

Musculoskeletal: No acute or significant osseous findings.
IMPRESSION: 1. No acute findings in the abdomen or pelvis.
2. Mild aortic Atherosclerosis ([id number]).

## 2023-02-03 ENCOUNTER — Other Ambulatory Visit: Payer: Self-pay | Admitting: Internal Medicine

## 2023-02-03 DIAGNOSIS — E559 Vitamin D deficiency, unspecified: Secondary | ICD-10-CM

## 2023-02-15 ENCOUNTER — Encounter: Payer: Self-pay | Admitting: Internal Medicine

## 2023-02-15 ENCOUNTER — Other Ambulatory Visit (HOSPITAL_COMMUNITY)
Admission: RE | Admit: 2023-02-15 | Discharge: 2023-02-15 | Disposition: A | Discharge status: Home / Self Care | Payer: BC Managed Care – PPO | Source: Ambulatory Visit | Attending: Internal Medicine | Admitting: Internal Medicine

## 2023-02-15 ENCOUNTER — Ambulatory Visit: Payer: BC Managed Care – PPO | Admitting: Internal Medicine

## 2023-02-15 VITALS — BP 122/80 | HR 72 | Temp 98.9°F | Ht 61.0 in | Wt 161.7 lb

## 2023-02-15 DIAGNOSIS — Z23 Encounter for immunization: Secondary | ICD-10-CM

## 2023-02-15 DIAGNOSIS — Z124 Encounter for screening for malignant neoplasm of cervix: Secondary | ICD-10-CM

## 2023-02-15 DIAGNOSIS — E782 Mixed hyperlipidemia: Secondary | ICD-10-CM

## 2023-02-15 DIAGNOSIS — Z1231 Encounter for screening mammogram for malignant neoplasm of breast: Secondary | ICD-10-CM

## 2023-02-15 DIAGNOSIS — E559 Vitamin D deficiency, unspecified: Secondary | ICD-10-CM

## 2023-02-15 NOTE — Progress Notes (Signed)
     Established Patient Office Visit     CC/Reason for Visit: Follow-up chronic conditions  HPI: Janet Lee is a 42 y.o. female who is coming in today for the above mentioned reasons.  She is here today to get a flu vaccine, to do her cervical cancer screening, requesting a screening mammogram and due for vitamin D follow-up.  She is feeling well.  Past Medical/Surgical History: History reviewed. No pertinent past medical history.  Past Surgical History:  Procedure Laterality Date   CESAREAN SECTION      Social History:  reports that she has quit smoking. Her smoking use included cigarettes. She has quit using smokeless tobacco. She reports current alcohol use. She reports that she does not use drugs.  Allergies: Allergies  Allergen Reactions   American Cockroach Shortness Of Breath   Cat Hair Extract Hives, Itching, Shortness Of Breath and Swelling   Dust Mite Extract Rash and Shortness Of Breath   Mixed Ragweed Shortness Of Breath and Swelling   Molds & Smuts Cough, Itching and Shortness Of Breath   Tree Extract Cough and Shortness Of Breath   Grass Pollen([id number]-Swt Vern) Itching and Rash    Family History:  Family History  Problem Relation Age of Onset   Heart disease Mother    Hypertension Mother    Alcohol abuse Father    Drug abuse Father    Heart disease Maternal Grandmother      Current Outpatient Medications:    albuterol (VENTOLIN HFA) 108 (90 Base) MCG/ACT inhaler, Inhale into the lungs., Disp: , Rfl:   Review of Systems:  Negative unless indicated in HPI.   Physical Exam: Vitals:   02/15/23 1531  BP: 122/80  Pulse: 72  Temp: 98.9 F (37.2 C)  TempSrc: Oral  SpO2: 100%  Weight: 161 lb 11.2 oz (73.3 kg)  Height: 5\' 1"  (1.549 m)    Body mass index is 30.55 kg/m.   Physical Exam Exam conducted with a chaperone present.  Chest:  Breasts:    Right: Normal.     Left: Normal.  Genitourinary:    Labia:        Right: No  lesion.        Left: No lesion.      Vagina: Normal.     Cervix: Normal. No discharge or erythema.      Impression and Plan:  Screening mammogram for breast cancer -     3D Screening Mammogram, Left and Right; Future  Screening for cervical cancer -     Cytology - PAP  Immunization due  Vitamin D deficiency  -Flu vaccine administered in office today. -Sent for mammogram. -Pap smear done in office today.   Time spent:22 minutes reviewing chart, interviewing and examining patient and formulating plan of care.     [name], MD [facility]

## 2023-02-15 NOTE — Addendum Note (Signed)
Addended by: Johnella Moloney on: 02/15/2023 04:02 PM   Modules accepted: Orders

## 2023-02-16 LAB — LIPID PANEL
Cholesterol: 197 mg/dL (ref 0–200)
HDL: 53.1 mg/dL (ref >39.00)
LDL Cholesterol: 105 mg/dL — ABNORMAL HIGH (ref 0–99)
NonHDL: 143.61
Total CHOL/HDL Ratio: 4
Triglycerides: 191 mg/dL — ABNORMAL HIGH (ref 0.0–149.0)
VLDL: 38.2 mg/dL (ref 0.0–40.0)

## 2023-02-16 LAB — VITAMIN D 25 HYDROXY (VIT D DEFICIENCY, FRACTURES): VITD: 48.5 ng/mL (ref 30.00–100.00)

## 2023-02-17 LAB — CYTOLOGY - PAP
Adequacy: ABSENT
Comment: NEGATIVE
Diagnosis: NEGATIVE
High risk HPV: NEGATIVE

## 2023-03-22 ENCOUNTER — Ambulatory Visit
Admission: RE | Admit: 2023-03-22 | Discharge: 2023-03-22 | Disposition: A | Discharge status: Home / Self Care | Payer: BC Managed Care – PPO | Source: Ambulatory Visit | Attending: Internal Medicine | Admitting: Internal Medicine

## 2023-03-22 DIAGNOSIS — Z1231 Encounter for screening mammogram for malignant neoplasm of breast: Secondary | ICD-10-CM

## 2023-04-04 ENCOUNTER — Other Ambulatory Visit: Payer: Self-pay | Admitting: Internal Medicine

## 2023-04-04 DIAGNOSIS — R928 Other abnormal and inconclusive findings on diagnostic imaging of breast: Secondary | ICD-10-CM

## 2023-05-05 ENCOUNTER — Ambulatory Visit: Payer: BC Managed Care – PPO

## 2023-05-05 ENCOUNTER — Ambulatory Visit
Admission: RE | Admit: 2023-05-05 | Discharge: 2023-05-05 | Disposition: A | Discharge status: Home / Self Care | Payer: 59 | Source: Ambulatory Visit | Attending: Internal Medicine | Admitting: Internal Medicine

## 2023-05-05 DIAGNOSIS — R928 Other abnormal and inconclusive findings on diagnostic imaging of breast: Secondary | ICD-10-CM

## 2023-05-14 ENCOUNTER — Other Ambulatory Visit: Payer: Self-pay | Admitting: Internal Medicine

## 2023-05-15 ENCOUNTER — Other Ambulatory Visit: Payer: Self-pay

## 2023-05-15 MED ORDER — ALBUTEROL SULFATE HFA 108 (90 BASE) MCG/ACT IN AERS
1.0000 | INHALATION_SPRAY | RESPIRATORY_TRACT | 2 refills | Status: DC | PRN
  Filled 2023-05-15: qty 6.7, 34d supply, fill #0

## 2023-05-16 ENCOUNTER — Other Ambulatory Visit: Payer: Self-pay

## 2023-05-17 ENCOUNTER — Other Ambulatory Visit (HOSPITAL_COMMUNITY): Payer: Self-pay

## 2023-05-17 ENCOUNTER — Other Ambulatory Visit: Payer: 59

## 2023-05-18 ENCOUNTER — Other Ambulatory Visit: Payer: 59

## 2023-05-19 ENCOUNTER — Other Ambulatory Visit: Payer: Self-pay

## 2023-10-07 ENCOUNTER — Encounter: Payer: Self-pay | Admitting: Internal Medicine

## 2023-10-09 MED ORDER — ALBUTEROL SULFATE HFA 108 (90 BASE) MCG/ACT IN AERS: 1.0000 | INHALATION_SPRAY | RESPIRATORY_TRACT | 2 refills | Status: AC | PRN

## 2024-02-23 ENCOUNTER — Telehealth

## 2024-02-29 ENCOUNTER — Encounter

## 2024-02-29 DIAGNOSIS — Z1231 Encounter for screening mammogram for malignant neoplasm of breast: Secondary | ICD-10-CM

## 2024-03-06 ENCOUNTER — Ambulatory Visit: Admitting: Internal Medicine
